# Patient Record
Sex: Male | Born: 1935 | Race: White | Hispanic: No | Smoking: Former smoker
Health system: Southern US, Community
[De-identification: ages and names within clinical notes are randomized; demographics above are authoritative.]

## PROBLEM LIST (undated history)

## (undated) DIAGNOSIS — N289 Disorder of kidney and ureter, unspecified: Secondary | ICD-10-CM

## (undated) DIAGNOSIS — I739 Peripheral vascular disease, unspecified: Secondary | ICD-10-CM

## (undated) DIAGNOSIS — I10 Essential (primary) hypertension: Secondary | ICD-10-CM

## (undated) DIAGNOSIS — I872 Venous insufficiency (chronic) (peripheral): Secondary | ICD-10-CM

## (undated) DIAGNOSIS — C4491 Basal cell carcinoma of skin, unspecified: Secondary | ICD-10-CM

## (undated) DIAGNOSIS — K219 Gastro-esophageal reflux disease without esophagitis: Secondary | ICD-10-CM

## (undated) DIAGNOSIS — C679 Malignant neoplasm of bladder, unspecified: Secondary | ICD-10-CM

## (undated) DIAGNOSIS — M4646 Discitis, unspecified, lumbar region: Secondary | ICD-10-CM

## (undated) HISTORY — PX: CAROTID STENT: SHX1301

## (undated) HISTORY — PX: SHOULDER ARTHROSCOPY DISTAL CLAVICLE EXCISION AND OPEN ROTATOR CUFF REPAIR: SHX2396

## (undated) HISTORY — DX: Malignant neoplasm of bladder, unspecified: C67.9

## (undated) HISTORY — DX: Essential (primary) hypertension: I10

## (undated) HISTORY — DX: Venous insufficiency (chronic) (peripheral): I87.2

## (undated) HISTORY — DX: Basal cell carcinoma of skin, unspecified: C44.91

## (undated) HISTORY — PX: APPENDECTOMY: SHX54

## (undated) HISTORY — DX: Disorder of kidney and ureter, unspecified: N28.9

## (undated) HISTORY — DX: Discitis, unspecified, lumbar region: M46.46

## (undated) HISTORY — DX: Peripheral vascular disease, unspecified: I73.9

## (undated) HISTORY — DX: Gastro-esophageal reflux disease without esophagitis: K21.9

---
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
## 1999-04-14 HISTORY — PX: ESOPHAGOGASTRODUODENOSCOPY: SHX1529

## 1999-05-05 ENCOUNTER — Encounter: Payer: Self-pay | Admitting: Gastroenterology

## 2001-10-24 ENCOUNTER — Encounter: Payer: Self-pay | Admitting: Internal Medicine

## 2001-12-12 HISTORY — PX: KNEE ARTHROSCOPY: SUR90

## 2003-02-04 ENCOUNTER — Encounter: Payer: Self-pay | Admitting: Internal Medicine

## 2004-08-21 ENCOUNTER — Ambulatory Visit: Payer: Self-pay | Admitting: Specialist

## 2004-09-01 ENCOUNTER — Ambulatory Visit: Payer: Self-pay | Admitting: Internal Medicine

## 2005-01-01 ENCOUNTER — Ambulatory Visit: Payer: Self-pay | Admitting: Internal Medicine

## 2005-02-12 ENCOUNTER — Ambulatory Visit: Payer: Self-pay | Admitting: Internal Medicine

## 2005-05-06 ENCOUNTER — Ambulatory Visit: Payer: Self-pay | Admitting: Internal Medicine

## 2005-08-31 ENCOUNTER — Ambulatory Visit: Payer: Self-pay | Admitting: Internal Medicine

## 2005-12-30 ENCOUNTER — Ambulatory Visit: Payer: Self-pay | Admitting: Internal Medicine

## 2006-02-11 ENCOUNTER — Ambulatory Visit: Payer: Self-pay | Admitting: Otolaryngology

## 2006-02-11 ENCOUNTER — Other Ambulatory Visit: Payer: Self-pay

## 2006-02-14 ENCOUNTER — Ambulatory Visit: Payer: Self-pay | Admitting: Internal Medicine

## 2006-02-16 ENCOUNTER — Ambulatory Visit: Payer: Self-pay | Admitting: Otolaryngology

## 2006-06-16 ENCOUNTER — Ambulatory Visit: Payer: Self-pay | Admitting: Internal Medicine

## 2006-07-12 ENCOUNTER — Ambulatory Visit: Payer: Self-pay | Admitting: Internal Medicine

## 2006-08-01 ENCOUNTER — Ambulatory Visit: Payer: Self-pay | Admitting: Internal Medicine

## 2006-12-20 ENCOUNTER — Ambulatory Visit: Payer: Self-pay | Admitting: Internal Medicine

## 2006-12-20 LAB — CONVERTED CEMR LAB
AST: 23 units/L (ref 0–37)
Albumin: 3.4 g/dL — ABNORMAL LOW (ref 3.5–5.2)
BUN: 21 mg/dL (ref 6–23)
CO2: 28 meq/L (ref 19–32)
Creatinine,U: 94.3 mg/dL
Phosphorus: 3.2 mg/dL (ref 2.3–4.6)
Potassium: 4.7 meq/L (ref 3.5–5.1)
Total Bilirubin: 0.6 mg/dL (ref 0.3–1.2)

## 2006-12-22 ENCOUNTER — Encounter: Payer: Self-pay | Admitting: Internal Medicine

## 2007-01-05 ENCOUNTER — Ambulatory Visit: Payer: Self-pay | Admitting: Internal Medicine

## 2007-01-17 ENCOUNTER — Ambulatory Visit: Payer: Self-pay | Admitting: Urology

## 2007-01-31 ENCOUNTER — Encounter: Payer: Self-pay | Admitting: Internal Medicine

## 2007-02-28 ENCOUNTER — Encounter: Payer: Self-pay | Admitting: Internal Medicine

## 2007-05-31 DIAGNOSIS — K219 Gastro-esophageal reflux disease without esophagitis: Secondary | ICD-10-CM

## 2007-05-31 DIAGNOSIS — I10 Essential (primary) hypertension: Secondary | ICD-10-CM | POA: Insufficient documentation

## 2007-05-31 DIAGNOSIS — E119 Type 2 diabetes mellitus without complications: Secondary | ICD-10-CM

## 2007-05-31 DIAGNOSIS — C679 Malignant neoplasm of bladder, unspecified: Secondary | ICD-10-CM

## 2007-05-31 DIAGNOSIS — I739 Peripheral vascular disease, unspecified: Secondary | ICD-10-CM | POA: Insufficient documentation

## 2007-05-31 DIAGNOSIS — I872 Venous insufficiency (chronic) (peripheral): Secondary | ICD-10-CM | POA: Insufficient documentation

## 2007-06-05 ENCOUNTER — Ambulatory Visit: Payer: Self-pay | Admitting: Internal Medicine

## 2007-06-05 DIAGNOSIS — J069 Acute upper respiratory infection, unspecified: Secondary | ICD-10-CM | POA: Insufficient documentation

## 2007-06-20 ENCOUNTER — Ambulatory Visit: Payer: Self-pay | Admitting: Internal Medicine

## 2007-06-20 DIAGNOSIS — L570 Actinic keratosis: Secondary | ICD-10-CM | POA: Insufficient documentation

## 2007-06-21 LAB — CONVERTED CEMR LAB
Albumin: 3.6 g/dL (ref 3.5–5.2)
BUN: 14 mg/dL (ref 6–23)
Calcium: 9.6 mg/dL (ref 8.4–10.5)
Chloride: 105 meq/L (ref 96–112)
Creatinine, Ser: 1.2 mg/dL (ref 0.4–1.5)
GFR calc non Af Amer: 62 mL/min
Hgb A1c MFr Bld: 7.1 % — ABNORMAL HIGH (ref 4.6–6.0)

## 2007-10-16 ENCOUNTER — Ambulatory Visit: Payer: Self-pay | Admitting: Internal Medicine

## 2007-10-17 ENCOUNTER — Encounter: Payer: Self-pay | Admitting: Internal Medicine

## 2007-10-18 LAB — CONVERTED CEMR LAB
BUN: 17 mg/dL (ref 6–23)
Basophils Absolute: 0 10*3/uL (ref 0.0–0.1)
Calcium: 9 mg/dL (ref 8.4–10.5)
Cholesterol: 190 mg/dL (ref 0–200)
Creatinine,U: 143 mg/dL
Eosinophils Absolute: 0.4 10*3/uL (ref 0.0–0.6)
GFR calc Af Amer: 54 mL/min
Glucose, Bld: 200 mg/dL — ABNORMAL HIGH (ref 70–99)
HDL: 42.8 mg/dL (ref >39.0)
MCHC: 33.3 g/dL (ref 30.0–36.0)
MCV: 92.2 fL (ref 78.0–100.0)
Microalb, Ur: 105.3 mg/dL — ABNORMAL HIGH (ref 0.0–1.9)
Neutro Abs: 6.8 10*3/uL (ref 1.4–7.7)
Phosphorus: 3.6 mg/dL (ref 2.3–4.6)
Platelets: 244 10*3/uL (ref 150–400)
Potassium: 4.8 meq/L (ref 3.5–5.1)
RBC: 4.19 M/uL — ABNORMAL LOW (ref 4.22–5.81)
TSH: 1.45 microintl units/mL (ref 0.35–5.50)
Triglycerides: 243 mg/dL (ref 0–149)

## 2008-01-08 ENCOUNTER — Telehealth (INDEPENDENT_AMBULATORY_CARE_PROVIDER_SITE_OTHER): Payer: Self-pay | Admitting: *Deleted

## 2008-01-18 ENCOUNTER — Encounter: Payer: Self-pay | Admitting: Internal Medicine

## 2008-02-12 ENCOUNTER — Ambulatory Visit: Payer: Self-pay | Admitting: Internal Medicine

## 2008-02-12 DIAGNOSIS — N259 Disorder resulting from impaired renal tubular function, unspecified: Secondary | ICD-10-CM | POA: Insufficient documentation

## 2008-02-13 LAB — CONVERTED CEMR LAB
Albumin: 3.6 g/dL (ref 3.5–5.2)
BUN: 17 mg/dL (ref 6–23)
Basophils Relative: 0.2 % (ref 0.0–1.0)
CO2: 27 meq/L (ref 19–32)
Calcium: 9.3 mg/dL (ref 8.4–10.5)
Creatinine, Ser: 1.4 mg/dL (ref 0.4–1.5)
GFR calc non Af Amer: 52 mL/min
Glucose, Bld: 203 mg/dL — ABNORMAL HIGH (ref 70–99)
HCT: 39.5 % (ref 39.0–52.0)
Hemoglobin: 13.6 g/dL (ref 13.0–17.0)
Lymphocytes Relative: 27 % (ref 12.0–46.0)
Monocytes Absolute: 0.6 10*3/uL (ref 0.1–1.0)
Monocytes Relative: 9.7 % (ref 3.0–12.0)
Neutro Abs: 4 10*3/uL (ref 1.4–7.7)
RBC: 4.26 M/uL (ref 4.22–5.81)
RDW: 14 % (ref 11.5–14.6)
Sodium: 141 meq/L (ref 135–145)

## 2008-04-22 ENCOUNTER — Encounter: Payer: Self-pay | Admitting: Internal Medicine

## 2008-05-22 ENCOUNTER — Ambulatory Visit: Payer: Self-pay | Admitting: Internal Medicine

## 2008-06-13 ENCOUNTER — Ambulatory Visit: Payer: Self-pay | Admitting: Gastroenterology

## 2008-06-14 ENCOUNTER — Ambulatory Visit: Payer: Self-pay | Admitting: Internal Medicine

## 2008-06-14 ENCOUNTER — Telehealth: Payer: Self-pay | Admitting: Gastroenterology

## 2008-06-17 ENCOUNTER — Ambulatory Visit: Payer: Self-pay | Admitting: Gastroenterology

## 2008-06-26 ENCOUNTER — Ambulatory Visit: Payer: Self-pay | Admitting: Gastroenterology

## 2008-06-26 LAB — CONVERTED CEMR LAB
Fecal Occult Blood: NEGATIVE
OCCULT 2: NEGATIVE
OCCULT 3: NEGATIVE
OCCULT 4: NEGATIVE
OCCULT 5: NEGATIVE

## 2008-06-27 ENCOUNTER — Ambulatory Visit: Payer: Self-pay | Admitting: Gastroenterology

## 2008-06-27 ENCOUNTER — Encounter: Payer: Self-pay | Admitting: Gastroenterology

## 2008-07-01 ENCOUNTER — Encounter: Payer: Self-pay | Admitting: Gastroenterology

## 2008-09-18 ENCOUNTER — Ambulatory Visit: Payer: Self-pay | Admitting: Family Medicine

## 2008-10-02 ENCOUNTER — Telehealth: Payer: Self-pay | Admitting: Internal Medicine

## 2008-10-18 ENCOUNTER — Telehealth: Payer: Self-pay | Admitting: Internal Medicine

## 2008-10-18 ENCOUNTER — Ambulatory Visit: Payer: Self-pay | Admitting: Internal Medicine

## 2008-10-21 LAB — CONVERTED CEMR LAB
BUN: 18 mg/dL (ref 6–23)
Calcium: 9.7 mg/dL (ref 8.4–10.5)
Chloride: 109 meq/L (ref 96–112)
Cholesterol: 194 mg/dL (ref 0–200)
Glucose, Bld: 151 mg/dL — ABNORMAL HIGH (ref 70–99)
Microalb, Ur: 0.5 mg/dL (ref 0.00–1.89)
Potassium: 5.3 meq/L (ref 3.5–5.3)
Sodium: 144 meq/L (ref 135–145)
Total CHOL/HDL Ratio: 3.6

## 2008-11-05 ENCOUNTER — Telehealth: Payer: Self-pay | Admitting: Internal Medicine

## 2008-12-30 ENCOUNTER — Ambulatory Visit: Payer: Self-pay | Admitting: Specialist

## 2009-02-24 ENCOUNTER — Ambulatory Visit: Payer: Self-pay

## 2009-03-18 ENCOUNTER — Ambulatory Visit: Payer: Self-pay | Admitting: Cardiovascular Disease

## 2009-03-18 ENCOUNTER — Ambulatory Visit: Payer: Self-pay | Admitting: Ophthalmology

## 2009-04-08 ENCOUNTER — Ambulatory Visit: Payer: Self-pay | Admitting: Ophthalmology

## 2009-06-09 ENCOUNTER — Ambulatory Visit: Payer: Self-pay | Admitting: Internal Medicine

## 2009-06-11 LAB — CONVERTED CEMR LAB
ALT: 12 units/L (ref 0–53)
AST: 18 units/L (ref 0–37)
Alkaline Phosphatase: 54 units/L (ref 39–117)
BUN: 20 mg/dL (ref 6–23)
Basophils Absolute: 0.3 10*3/uL — ABNORMAL HIGH (ref 0.0–0.1)
CO2: 27 meq/L (ref 19–32)
Chloride: 113 meq/L — ABNORMAL HIGH (ref 96–112)
Creatinine, Ser: 1.2 mg/dL (ref 0.4–1.5)
Eosinophils Relative: 3.5 % (ref 0.0–5.0)
GFR calc non Af Amer: 61.68 mL/min (ref >60)
Hemoglobin: 13.5 g/dL (ref 13.0–17.0)
Hgb A1c MFr Bld: 6.2 % (ref 4.6–6.5)
Lymphocytes Relative: 25.5 % (ref 12.0–46.0)
Monocytes Relative: 9.3 % (ref 3.0–12.0)
Phosphorus: 3.7 mg/dL (ref 2.3–4.6)
Platelets: 226 10*3/uL (ref 150.0–400.0)
RDW: 13.5 % (ref 11.5–14.6)
Sodium: 144 meq/L (ref 135–145)
Total Bilirubin: 0.6 mg/dL (ref 0.3–1.2)
WBC: 10.5 10*3/uL (ref 4.5–10.5)

## 2009-08-10 ENCOUNTER — Inpatient Hospital Stay: Payer: Self-pay | Admitting: Internal Medicine

## 2009-08-10 ENCOUNTER — Ambulatory Visit: Payer: Self-pay | Admitting: Cardiology

## 2009-08-11 ENCOUNTER — Telehealth: Payer: Self-pay | Admitting: Internal Medicine

## 2009-08-13 ENCOUNTER — Ambulatory Visit: Payer: Self-pay

## 2009-08-19 ENCOUNTER — Encounter: Payer: Self-pay | Admitting: Internal Medicine

## 2009-09-01 ENCOUNTER — Encounter: Payer: Self-pay | Admitting: Internal Medicine

## 2009-09-02 DIAGNOSIS — G062 Extradural and subdural abscess, unspecified: Secondary | ICD-10-CM | POA: Insufficient documentation

## 2009-09-03 ENCOUNTER — Ambulatory Visit: Payer: Self-pay | Admitting: Specialist

## 2009-09-03 ENCOUNTER — Telehealth: Payer: Self-pay | Admitting: Internal Medicine

## 2009-09-09 ENCOUNTER — Encounter: Payer: Self-pay | Admitting: Internal Medicine

## 2009-10-06 ENCOUNTER — Ambulatory Visit: Payer: Self-pay | Admitting: Specialist

## 2009-10-28 ENCOUNTER — Ambulatory Visit: Payer: Self-pay | Admitting: Internal Medicine

## 2009-11-03 ENCOUNTER — Ambulatory Visit: Payer: Self-pay | Admitting: Specialist

## 2009-11-06 ENCOUNTER — Ambulatory Visit: Payer: Self-pay | Admitting: Internal Medicine

## 2009-11-10 ENCOUNTER — Encounter: Payer: Self-pay | Admitting: Internal Medicine

## 2009-11-11 ENCOUNTER — Ambulatory Visit: Payer: Self-pay

## 2009-11-18 ENCOUNTER — Ambulatory Visit: Payer: Self-pay | Admitting: Vascular Surgery

## 2009-11-20 ENCOUNTER — Ambulatory Visit: Payer: Self-pay | Admitting: Internal Medicine

## 2009-12-01 ENCOUNTER — Encounter: Payer: Self-pay | Admitting: Internal Medicine

## 2009-12-05 ENCOUNTER — Ambulatory Visit: Payer: Self-pay

## 2009-12-08 ENCOUNTER — Ambulatory Visit: Payer: Self-pay | Admitting: Internal Medicine

## 2009-12-10 LAB — CONVERTED CEMR LAB
AST: 18 units/L (ref 0–37)
Albumin: 3.2 g/dL — ABNORMAL LOW (ref 3.5–5.2)
BUN: 19 mg/dL (ref 6–23)
Basophils Absolute: 0 10*3/uL (ref 0.0–0.1)
CO2: 30 meq/L (ref 19–32)
Calcium: 9.5 mg/dL (ref 8.4–10.5)
Chloride: 101 meq/L (ref 96–112)
Eosinophils Absolute: 0 10*3/uL (ref 0.0–0.7)
HCT: 37.4 % — ABNORMAL LOW (ref 39.0–52.0)
Hemoglobin: 12.2 g/dL — ABNORMAL LOW (ref 13.0–17.0)
Lymphs Abs: 0.9 10*3/uL (ref 0.7–4.0)
MCHC: 32.6 g/dL (ref 30.0–36.0)
MCV: 93.4 fL (ref 78.0–100.0)
Monocytes Absolute: 0.9 10*3/uL (ref 0.1–1.0)
Monocytes Relative: 7.8 % (ref 3.0–12.0)
Neutro Abs: 9.3 10*3/uL — ABNORMAL HIGH (ref 1.4–7.7)
RDW: 14.3 % (ref 11.5–14.6)

## 2009-12-11 ENCOUNTER — Encounter: Payer: Self-pay | Admitting: Internal Medicine

## 2009-12-11 ENCOUNTER — Ambulatory Visit: Payer: Self-pay | Admitting: Internal Medicine

## 2009-12-12 ENCOUNTER — Ambulatory Visit: Payer: Self-pay

## 2009-12-18 ENCOUNTER — Ambulatory Visit: Payer: Self-pay | Admitting: Specialist

## 2009-12-19 ENCOUNTER — Ambulatory Visit: Payer: Self-pay | Admitting: Internal Medicine

## 2009-12-19 ENCOUNTER — Telehealth: Payer: Self-pay | Admitting: Internal Medicine

## 2009-12-19 DIAGNOSIS — B356 Tinea cruris: Secondary | ICD-10-CM

## 2009-12-25 ENCOUNTER — Ambulatory Visit: Payer: Self-pay | Admitting: Internal Medicine

## 2010-01-09 ENCOUNTER — Encounter: Payer: Self-pay | Admitting: Internal Medicine

## 2010-01-11 HISTORY — PX: CHOLECYSTECTOMY: SHX55

## 2010-01-13 ENCOUNTER — Ambulatory Visit: Payer: Self-pay | Admitting: Internal Medicine

## 2010-01-20 ENCOUNTER — Ambulatory Visit: Payer: Self-pay | Admitting: Internal Medicine

## 2010-01-25 ENCOUNTER — Ambulatory Visit: Payer: Self-pay | Admitting: Cardiovascular Disease

## 2010-01-25 ENCOUNTER — Inpatient Hospital Stay: Payer: Self-pay | Admitting: Surgery

## 2010-01-26 ENCOUNTER — Encounter: Payer: Self-pay | Admitting: Internal Medicine

## 2010-02-05 ENCOUNTER — Telehealth: Payer: Self-pay | Admitting: Internal Medicine

## 2010-02-07 IMAGING — XA DG CHEST 1V
1 series · 1 of 1 positions shown · non-contrast
Comparison: none

REASON FOR EXAM: PICC Line Placement
COMMENTS:

PROCEDURE:     VAS - CHEST FRONTAL SINGLE VIEW  - August 15, 2009  [DATE]
RESULT:     Spot film chest reveals a PICC line in the region of the
superior vena cava/right atrial junction.

[Series 1: single · 1 of 1 slices shown]
[im 1/1]
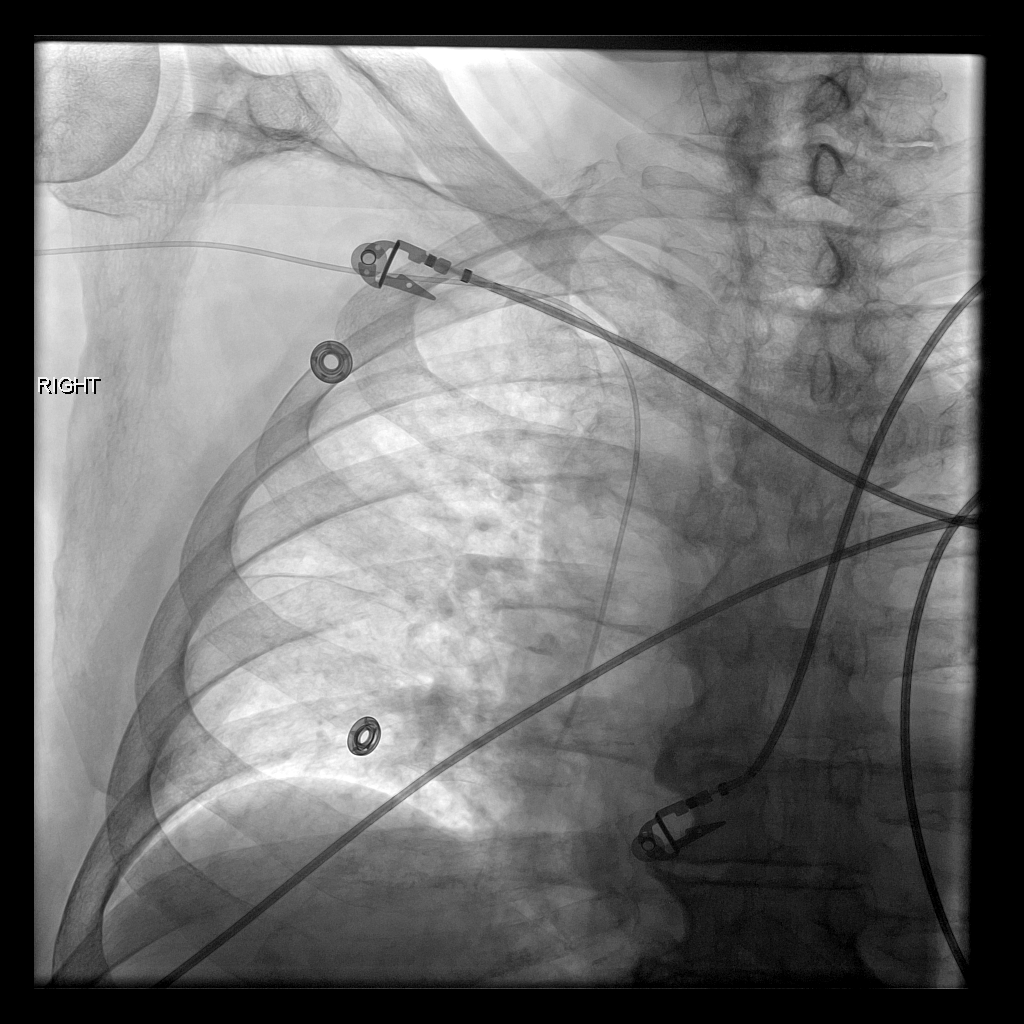

[1 of 1 positions shown; findings below may reference images not displayed]

IMPRESSION: Good anatomic positioning of PICC line.

## 2010-02-13 ENCOUNTER — Ambulatory Visit: Payer: Self-pay | Admitting: Internal Medicine

## 2010-02-17 ENCOUNTER — Encounter: Payer: Self-pay | Admitting: Internal Medicine

## 2010-02-23 ENCOUNTER — Encounter: Payer: Self-pay | Admitting: Internal Medicine

## 2010-02-27 ENCOUNTER — Ambulatory Visit: Payer: Self-pay | Admitting: Internal Medicine

## 2010-03-02 ENCOUNTER — Telehealth: Payer: Self-pay | Admitting: Internal Medicine

## 2010-03-13 HISTORY — PX: TOE AMPUTATION: SHX809

## 2010-03-20 ENCOUNTER — Ambulatory Visit: Payer: Self-pay | Admitting: Vascular Surgery

## 2010-03-24 ENCOUNTER — Ambulatory Visit: Payer: Self-pay | Admitting: Vascular Surgery

## 2010-03-26 LAB — PATHOLOGY REPORT

## 2010-03-27 ENCOUNTER — Ambulatory Visit: Payer: Self-pay | Admitting: Internal Medicine

## 2010-04-06 ENCOUNTER — Telehealth: Payer: Self-pay | Admitting: Internal Medicine

## 2010-04-17 ENCOUNTER — Encounter: Payer: Self-pay | Admitting: Internal Medicine

## 2010-05-01 ENCOUNTER — Ambulatory Visit: Payer: Self-pay | Admitting: Internal Medicine

## 2010-05-04 LAB — CONVERTED CEMR LAB
ALT: 11 units/L (ref 0–53)
AST: 14 units/L (ref 0–37)
Bilirubin, Direct: 0.1 mg/dL (ref 0.0–0.3)
CO2: 27 meq/L (ref 19–32)
Creatinine, Ser: 1.2 mg/dL (ref 0.4–1.5)
Eosinophils Relative: 2 % (ref 0.0–5.0)
GFR calc non Af Amer: 64 mL/min (ref >60)
Glucose, Bld: 89 mg/dL (ref 70–99)
HCT: 34.4 % — ABNORMAL LOW (ref 39.0–52.0)
Hgb A1c MFr Bld: 6 % (ref 4.6–6.5)
Monocytes Relative: 9.6 % (ref 3.0–12.0)
Neutrophils Relative %: 58.4 % (ref 43.0–77.0)
Platelets: 227 10*3/uL (ref 150.0–400.0)
Potassium: 4.7 meq/L (ref 3.5–5.1)
Sodium: 143 meq/L (ref 135–145)
Total Bilirubin: 0.4 mg/dL (ref 0.3–1.2)
WBC: 8.8 10*3/uL (ref 4.5–10.5)

## 2010-05-14 ENCOUNTER — Encounter: Payer: Self-pay | Admitting: Internal Medicine

## 2010-05-25 ENCOUNTER — Telehealth: Payer: Self-pay | Admitting: Internal Medicine

## 2010-07-24 ENCOUNTER — Encounter: Payer: Self-pay | Admitting: Internal Medicine

## 2010-08-25 ENCOUNTER — Encounter: Payer: Self-pay | Admitting: Internal Medicine

## 2010-08-31 ENCOUNTER — Ambulatory Visit: Payer: Self-pay | Admitting: Internal Medicine

## 2010-10-14 NOTE — Assessment & Plan Note (Signed)
Summary: ROA 1 WK  CYD   Vital Signs:  Patient profile:   77 year old male Weight:      205 pounds Temp:     98.5 degrees F oral Pulse rate:   68 / minute Pulse rhythm:   regular BP sitting:   170 / 60  (left arm) Cuff size:   large  Vitals Entered By: Mervin Hack CMA Duncan Dull) (December 19, 2009 11:38 AM) CC: 1 week follow-up   History of Present Illness: On two times a day rocephin still Sees Dr Blocker later today--may be approaching end of treatment course for the discitis  Went back to his 70/30 regimen Had to cut dose from 33 to 25 ongoing hypoglycemic spells that are now better on the lower dose  Doesn't check BP No headache No chest pain No sig SOB  Living on his own Has nurse from [facility] and aide Changing foot bandage and instructing about PICC line managing meds   Still with occ pain Bad when he forgot the oxycontin one morning when going for MRI only takes the oxycodone occ--most is 1 per day hasn't been using the patch  Rash in right groin no tiching using desitin and A&D no other similar areas  Allergies: No Known Drug Allergies  Past History:  Past medical, surgical, family and social histories (including risk factors) reviewed for relevance to current acute and chronic problems.  Past Medical History: Reviewed history from 02/12/2008 and no changes required. Diabetes mellitus, type II--nephropathy, neuropathy, retinopathy, PVD Peripheral vascular disease Hypertension GERD/esophagitis Bladder cancer Basal cell carcinoma--R temple Chronic venous insufficiency Renal insufficiency  CONSULTANTS Dr Orson Slick  [phone] Dr Al Corpus Dr Guinevere Scarlet Dr Melinda Crutch [phone]  Past Surgical History: Reviewed history from 12/08/2009 and no changes required. EGD 08/00 TURBT (pedunculated)  ~1993 Left shoulder separation repair 1950's Left knee arthroscopy Hyacinth Meeker) 04/03 TURBT 09/04 ABIs abnormal 10/07 Appendectomy Clavicle repair Epidural  abscess 12/10   [facility]  (medical Rx only) Stent left leg--Dr Earnestine Leys  Family History: Reviewed history from 08/13/2009 and no changes required. Father died of heart disease No FH of Colon Cancer: History is remarkable for diabetes  Social History: Reviewed history from 06/13/2008 and no changes required. Widowed 4/07 Children: 1 son and 1 daughter Alcohol use-no Retired Patient is a former smoker.  Daily Caffeine Use  Physical Exam  General:  alert.  NAD Neck:  supple and no masses.   Lungs:  normal respiratory effort and normal breath sounds.   Heart:  normal rate, regular rhythm, no murmur, and no gallop.   Skin:  scaly rash scattered along right inguinal area Psych:  normally interactive, good eye contact, not anxious appearing, and not depressed appearing.     Impression & Recommendations:  Problem # 1:  TINEA CRURIS (ICD-110.3) Assessment New will try ketoconazole cream may need as long as he is still on the antibiotics  Problem # 2:  DIABETES MELLITUS, TYPE II (ICD-250.00) control is good no clear reason why he is on prednisone----may have been initally with inflammation around infected disc will wean off and may need lower dose of insulin  His updated medication list for this problem includes:    Aspirin 81 Mg Tbec (Aspirin) .Marland Kitchen... Take one by mouth once a day    Novolog Mix 70/30 Flexpen 70-30 % Susp (Insulin aspart prot & aspart) ..... Inject 25 units subcutaneously two times a day and adjust as directed.    Glipizide 5 Mg Xr24h-tab (Glipizide) .Marland Kitchen... 1 daily  before breakfast for diabetes  Labs Reviewed: Creat: 1.4 (12/08/2009)     Last Eye Exam: retinopathy stable (10/16/2008) Reviewed HgBA1c results: 6.7 (12/08/2009)  6.2 (06/09/2009)  Problem # 3:  ABSCESS, EPIDURAL (ICD-324.9) Assessment: Improved not taking fentanyl will continue the oxycontin for pain  Problem # 4:  HYPERTENSION (ICD-401.9) Assessment: Unchanged BP up some meds adjusted quite a bit  throughout this ordeal no changes now will review at his follow up  His updated medication list for this problem includes:    Norvasc 10 Mg Tabs (Amlodipine besylate) .Marland Kitchen... Take one by mouth once a day    Furosemide 40 Mg Tabs (Furosemide) .Marland Kitchen... 1 tab daily as needed for increased leg swelling    Clonidine Hcl 0.1 Mg/24hr Ptwk (Clonidine hcl) .Marland Kitchen... Apply 1 patch weekly for high blood pressure    Metoprolol Succinate 100 Mg Xr24h-tab (Metoprolol succinate) .Marland Kitchen... 1 tab daily for high blood pressure  BP today: 170/60 Prior BP: 150/60 (12/08/2009)  Labs Reviewed: K+: 3.9 (12/08/2009) Creat: : 1.4 (12/08/2009)   Chol: 194 (10/18/2008)   HDL: 54 (10/18/2008)   LDL: 85 (10/18/2008)   TG: 275 (10/18/2008)  Complete Medication List: 1)  Aspirin 81 Mg Tbec (Aspirin) .... Take one by mouth once a day 2)  Omeprazole 20 Mg Cpdr (Omeprazole) .... Take one by mouth once a day 3)  Norvasc 10 Mg Tabs (Amlodipine besylate) .... Take one by mouth once a day 4)  Vitamin E 400 Unit Caps (Vitamin e) .... Take one by mouth two times a day 5)  Cosamin Ds 500-400 Mg Tabs (Glucosamine-chondroitin) .... Take one by mouth once a day 6)  Novolog Mix 70/30 Flexpen 70-30 % Susp (Insulin aspart prot & aspart) .... Inject 25 units subcutaneously two times a day and adjust as directed. 7)  Furosemide 40 Mg Tabs (Furosemide) .Marland Kitchen.. 1 tab daily as needed for increased leg swelling 8)  Oxycontin 20 Mg Xr12h-tab (Oxycodone hcl) .Marland Kitchen.. 1 tab by mouth two times a day 9)  Oxycodone Hcl 5 Mg Tabs (Oxycodone hcl) .Marland Kitchen.. 1 by mouth every 4 hours as needed for pain 10)  Prednisone 10 Mg Tabs (Prednisone) .Marland Kitchen.. 1 tab every other day for 2 weeks then stop 11)  Clonidine Hcl 0.1 Mg/24hr Ptwk (Clonidine hcl) .... Apply 1 patch weekly for high blood pressure 12)  Metoprolol Succinate 100 Mg Xr24h-tab (Metoprolol succinate) .Marland Kitchen.. 1 tab daily for high blood pressure 13)  Miralax Powd (Polyethylene glycol 3350) .Marland KitchenMarland Kitchen. 17 gm with water two times a  day to keep bowels working 14)  Senokot S 8.6-50 Mg Tabs (Sennosides-docusate sodium) .... 2 tabs daily to prevent constipation 15)  Glipizide 5 Mg Xr24h-tab (Glipizide) .Marland Kitchen.. 1 daily before breakfast for diabetes 16)  Celebrex 400 Mg Caps (Celecoxib) .... Take 1 by mouth once daily  Patient Instructions: 1)  Please change the prednisone to 10mg  every other day for the next 2 weeks, then stop it..You may need to further decrease the insulin after this. 2)  Please schedule a follow-up appointment in 1 month.  Prescriptions: OXYCONTIN 20 MG XR12H-TAB (OXYCODONE HCL) 1 tab by mouth two times a day  #60 x 0   Entered and Authorized by:   Cindee Salt MD   Signed by:   Cindee Salt MD on 12/19/2009   Method used:   Print then Give to Patient   RxID:   [id number] PREDNISONE 10 MG TABS (PREDNISONE) 1 tab every other day for 2 weeks then stop  #7 x 0  Entered and Authorized by:   Cindee Salt MD   Signed by:   Cindee Salt MD on 12/19/2009   Method used:   Electronically to        [facility] (retail)       [address]       [place], [place]  [id number]       Ph: [phone]       Fax: [phone]   RxID:   [id number]   Current Allergies (reviewed today): No known allergies

## 2010-10-14 NOTE — Letter (Signed)
Summary: [facility]   Imported By: Lanelle Bal 09/15/2009 13:39:57  _____________________________________________________________________  External Attachment:    Type:   Image     Comment:   External Document  Appended Document: [facility] rocephin increased for epidural abscess no surgery

## 2010-10-14 NOTE — Letter (Signed)
Summary: Tora Kindred MD  Tora Kindred MD   Imported By: Lanelle Bal 11/13/2009 12:00:53  _____________________________________________________________________  External Attachment:    Type:   Image     Comment:   External Document  Appended Document: Tora Kindred MD non healing ulcers doing angiogram to see if revasularization is plausible

## 2010-10-14 NOTE — Progress Notes (Signed)
Summary: Oxycontin  Phone Note Refill Request Call back at Home Phone [phone] Message from:  Patient on March 02, 2010 9:47 AM  Refills Requested: Medication #1:  OXYCONTIN 20 MG XR12H-TAB 1 tab by mouth two times a day Please call patient when prescription is ready for pickup.    Method Requested: Pick up at Office Initial call taken by: Delilah Shan CMA Duncan Dull),  March 02, 2010 9:47 AM  Follow-up for Phone Call        Rx written Follow-up by: Cindee Salt MD,  March 02, 2010 1:14 PM  Additional Follow-up for Phone Call Additional follow up Details #1::        Advised pt ok to pick up. Additional Follow-up by: Lowella Petties CMA,  March 02, 2010 2:58 PM    Prescriptions: OXYCONTIN 20 MG XR12H-TAB (OXYCODONE HCL) 1 tab by mouth two times a day  #60 x 0   Entered and Authorized by:   Cindee Salt MD   Signed by:   Cindee Salt MD on 03/02/2010   Method used:   Print then Give to Patient   RxID:   [id number]

## 2010-10-14 NOTE — Assessment & Plan Note (Signed)
Summary: 6 MTH FU/CLE   Vital Signs:  Patient profile:   77 year old male Weight:      210 pounds Temp:     98.1 degrees F oral Pulse rate:   80 / minute Pulse rhythm:   regular BP sitting:   150 / 60  (left arm) Cuff size:   large  Vitals Entered By: Mervin Hack CMA Duncan Dull) (December 08, 2009 12:35 PM) CC: 6 month follow-up   History of Present Illness: admitted with epidural abscess in December No surgery Still getting IV antibiotics  IN [place] till he went home 2 days ago many changes in meds  ongoing pain--using oxycodone ER and rapid acting Dr Blocker handling ID consults Dr Alison Murray managing diabetic wounds  Has recent follow up with neurosurgeon improving and no changes made  checks sugars himself again had been getting every 4 hours and getting coverage he is back to his 70/30 insulin and his previous dosing  No chest pain No SOB  Living alone Neighbor came with him today  Allergies: No Known Drug Allergies  Past History:  Past medical, surgical, family and social histories (including risk factors) reviewed for relevance to current acute and chronic problems.  Past Medical History: Reviewed history from 02/12/2008 and no changes required. Diabetes mellitus, type II--nephropathy, neuropathy, retinopathy, PVD Peripheral vascular disease Hypertension GERD/esophagitis Bladder cancer Basal cell carcinoma--R temple Chronic venous insufficiency Renal insufficiency  CONSULTANTS Dr Orson Slick  [phone] Dr Al Corpus Dr Guinevere Scarlet Dr Melinda Crutch [phone]  Past Surgical History: EGD 08/00 TURBT (pedunculated)  ~1993 Left shoulder separation repair 1950's Left knee arthroscopy Hyacinth Meeker) 04/03 TURBT 09/04 ABIs abnormal 10/07 Appendectomy Clavicle repair Epidural abscess 12/10   [facility]  (medical Rx only) Stent left leg--Dr Earnestine Leys  Family History: Reviewed history from 08/13/2009 and no changes required. Father died of heart disease No FH of Colon  Cancer: History is remarkable for diabetes  Social History: Reviewed history from 06/13/2008 and no changes required. Widowed 4/07 Children: 1 son and 1 daughter Alcohol use-no Retired Patient is a former smoker.  Daily Caffeine Use  Review of Systems       weight loss of 50# through this illness sleeping okay Modd is okay--not depressed  Physical Exam  General:  alert.  NAD Neck:  supple, no masses, no thyromegaly, and no cervical lymphadenopathy.   Lungs:  normal respiratory effort and normal breath sounds.   Heart:  normal rate, no murmur, and no gallop.   Regular in general but occ skip beats Abdomen:  soft and non-tender.   Msk:  non tender bulge over L2 or 3 Extremities:  mild edema Skin:  left foot in iodosorb dressing Not examined Psych:  normally interactive, good eye contact, not anxious appearing, and not depressed appearing.     Impression & Recommendations:  Problem # 1:  ABSCESS, EPIDURAL (ICD-324.9) Assessment New on rocephin followed by Dr Blocker Not sure why he is on the prednisone will adjust his pain meds at next visit  Problem # 2:  DIABETES MELLITUS, TYPE II (ICD-250.00) Assessment: Comment Only  low sugar reaction here---needed glucose tabs and nabs went back to novolog 70/30 Now on glipizide instead of metformin---??due to renal problems  The following medications were removed from the medication list:    Metformin Hcl 1000 Mg Tabs (Metformin hcl) .Marland Kitchen... 1 tablet by mouth twice a day    Lisinopril-hydrochlorothiazide 20-12.5 Mg Tabs (Lisinopril-hydrochlorothiazide) .Marland Kitchen... Take one by mouth two times a day His updated medication list for this  problem includes:    Aspirin 81 Mg Tbec (Aspirin) .Marland Kitchen... Take one by mouth once a day    Novolog Mix 70/30 Flexpen 70-30 % Susp (Insulin aspart prot & aspart) ..... Inject 33 units subcutaneously two times a day and adjust as directed.    Glipizide 5 Mg Xr24h-tab (Glipizide) .Marland Kitchen... 1 daily before  breakfast for diabetes  Orders: TLB-A1C / Hgb A1C (Glycohemoglobin) (83036-A1C) Venipuncture (16109) TLB-Renal Function Panel (80069-RENAL) TLB-CBC Platelet - w/Differential (85025-CBCD) TLB-Hepatic/Liver Function Pnl (80076-HEPATIC)  Problem # 3:  HYPERTENSION (ICD-401.9) Assessment: Unchanged fair control list adjusted to changes made in hospital or nursing home  The following medications were removed from the medication list:    Zebeta 5 Mg Tabs (Bisoprolol fumarate) .Marland Kitchen... Take one by mouth once a day    Lisinopril-hydrochlorothiazide 20-12.5 Mg Tabs (Lisinopril-hydrochlorothiazide) .Marland Kitchen... Take one by mouth two times a day His updated medication list for this problem includes:    Norvasc 10 Mg Tabs (Amlodipine besylate) .Marland Kitchen... Take one by mouth once a day    Furosemide 40 Mg Tabs (Furosemide) .Marland Kitchen... 1 tab daily as needed for increased leg swelling    Clonidine Hcl 0.1 Mg/24hr Ptwk (Clonidine hcl) .Marland Kitchen... Apply 1 patch weekly for high blood pressure    Metoprolol Succinate 100 Mg Xr24h-tab (Metoprolol succinate) .Marland Kitchen... 1 tab daily for high blood pressure  BP today: 150/60 Prior BP: 160/70 (06/09/2009)  Labs Reviewed: K+: 4.9 (06/09/2009) Creat: : 1.2 (06/09/2009)   Chol: 194 (10/18/2008)   HDL: 54 (10/18/2008)   LDL: 85 (10/18/2008)   TG: 275 (10/18/2008)  Complete Medication List: 1)  Aspirin 81 Mg Tbec (Aspirin) .... Take one by mouth once a day 2)  Omeprazole 20 Mg Cpdr (Omeprazole) .... Take one by mouth once a day 3)  Norvasc 10 Mg Tabs (Amlodipine besylate) .... Take one by mouth once a day 4)  Vitamin E 400 Unit Caps (Vitamin e) .... Take one by mouth two times a day 5)  Cosamin Ds 500-400 Mg Tabs (Glucosamine-chondroitin) .... Take one by mouth once a day 6)  Bd Single Use Swabs Regular Pads (Alcohol swabs) .... Use as directed 7)  Novolog Mix 70/30 Flexpen 70-30 % Susp (Insulin aspart prot & aspart) .... Inject 33 units subcutaneously two times a day and adjust as  directed. 8)  Furosemide 40 Mg Tabs (Furosemide) .Marland Kitchen.. 1 tab daily as needed for increased leg swelling 9)  Oxycontin 20 Mg Xr12h-tab (Oxycodone hcl) .Marland Kitchen.. 1 tab by mouth two times a day 10)  Oxycodone Hcl 5 Mg Tabs (Oxycodone hcl) .Marland Kitchen.. 1 by mouth every 4 hours as needed for pain 11)  Prednisone 10 Mg Tabs (Prednisone) .Marland Kitchen.. 1 tab daily 12)  Clonidine Hcl 0.1 Mg/24hr Ptwk (Clonidine hcl) .... Apply 1 patch weekly for high blood pressure 13)  Metoprolol Succinate 100 Mg Xr24h-tab (Metoprolol succinate) .Marland Kitchen.. 1 tab daily for high blood pressure 14)  Fentanyl 75 Mcg/hr Pt72 (Fentanyl) .... Apply 1 patch every 3 days for pain control 15)  Miralax Powd (Polyethylene glycol 3350) .Marland KitchenMarland Kitchen. 17 gm with water two times a day to keep bowels working 16)  Senokot S 8.6-50 Mg Tabs (Sennosides-docusate sodium) .... 2 tabs daily to prevent constipation 17)  Glipizide 5 Mg Xr24h-tab (Glipizide) .Marland Kitchen.. 1 daily before breakfast for diabetes  Patient Instructions: 1)  Go back to your usual insulin 2)  Keep on the other mediciations as listed on this sheet 3)  Continue the rocephin antibiotic intravenously till Dr Leavy Cella tells you to stop 4)  Note how your pain is and how often you need the extra pain pill and we will adjust at your next visit 5)  Please schedule a follow-up appointment in 1 week.  Current Allergies (reviewed today): No known allergies

## 2010-10-14 NOTE — Progress Notes (Signed)
Summary: refill request for oxycontin  Phone Note Refill Request Call back at Home Phone [phone] Message from:  Patient  Refills Requested: Medication #1:  OXYCONTIN 20 MG XR12H-TAB 1 tab by mouth two times a day Please call pt when ready.  Initial call taken by: Lowella Petties CMA,  May 25, 2010 11:34 AM  Follow-up for Phone Call        Rx written Follow-up by: Cindee Salt MD,  May 25, 2010 1:58 PM  Additional Follow-up for Phone Call Additional follow up Details #1::        Spoke with patient and advised rx ready for pick-up  Additional Follow-up by: Mervin Hack CMA Duncan Dull),  May 25, 2010 2:26 PM    New/Updated Medications: OXYCONTIN 20 MG XR12H-TAB (OXYCODONE HCL) 1 tab by mouth two times a day Prescriptions: OXYCONTIN 20 MG XR12H-TAB (OXYCODONE HCL) 1 tab by mouth two times a day  #60 x 0   Entered and Authorized by:   Cindee Salt MD   Signed by:   Cindee Salt MD on 05/25/2010   Method used:   Print then Give to Patient   RxID:   [id number]

## 2010-10-14 NOTE — Assessment & Plan Note (Signed)
Summary: 1 MONTH FOLLOW UP/[facility]   Vital Signs:  Patient profile:   77 year old male Weight:      210 pounds Temp:     98.2 degrees F oral Pulse rate:   56 / minute Pulse rhythm:   regular BP sitting:   130 / 60  (left arm) Cuff size:   large  Vitals Entered By: Mervin Hack CMA Duncan Dull) (Jan 20, 2010 11:40 AM) CC: 1 month follow-up   History of Present Illness: Feels perhaps a little more normal  Still has lots of pain on the oxycodone for pain Using the oxycontin regularly but not needing the rescue meds as often  Saw nephrologist started enalapril held lasix for several days--- didn't notice any sig fluid changes  Checks sugars regularly Have dropped off the prednisone has gone down to 18 two times a day of insulin Never above 200 still with several crashes---discussed cutting back more  Breathing is okay walking with rollator--for security---esp with ongoing back pain No chest pain  Allergies: No Known Drug Allergies  Past History:  Past medical, surgical, family and social histories (including risk factors) reviewed for relevance to current acute and chronic problems.  Past Medical History: Reviewed history from 02/12/2008 and no changes required. Diabetes mellitus, type II--nephropathy, neuropathy, retinopathy, PVD Peripheral vascular disease Hypertension GERD/esophagitis Bladder cancer Basal cell carcinoma--R temple Chronic venous insufficiency Renal insufficiency  CONSULTANTS Dr Orson Slick  [phone] Dr Al Corpus Dr Guinevere Scarlet Dr Melinda Crutch [phone]  Past Surgical History: Reviewed history from 12/08/2009 and no changes required. EGD 08/00 TURBT (pedunculated)  ~1993 Left shoulder separation repair 1950's Left knee arthroscopy Hyacinth Meeker) 04/03 TURBT 09/04 ABIs abnormal 10/07 Appendectomy Clavicle repair Epidural abscess 12/10   [facility]  (medical Rx only) Stent left leg--Dr Earnestine Leys  Family History: Reviewed history from 08/13/2009 and no changes  required. Father died of heart disease No FH of Colon Cancer: History is remarkable for diabetes  Social History: Reviewed history from 06/13/2008 and no changes required. Widowed 4/07 Children: 1 son and 1 daughter Alcohol use-no Retired Patient is a former smoker.  Daily Caffeine Use  Review of Systems       eating okay has noted some fluid accumulation--mostly in left leg-------discussed using the lasix if this happens  Physical Exam  General:  alert.  NAD Neck:  supple, no masses, and no thyromegaly.   Lungs:  normal respiratory effort and normal breath sounds.   Heart:  normal rate, regular rhythm, no murmur, and no gallop.   Extremities:  trace edema in left foot Psych:  normally interactive, good eye contact, not anxious appearing, and not depressed appearing.     Impression & Recommendations:  Problem # 1:  DIABETES MELLITUS, TYPE II (ICD-250.00) Assessment Comment Only lower with prednisone off may need to reduce insulin further  His updated medication list for this problem includes:    Aspirin 81 Mg Tbec (Aspirin) .Marland Kitchen... Take one by mouth once a day    Novolog Mix 70/30 Flexpen 70-30 % Susp (Insulin aspart prot & aspart) ..... Inject 18 units subcutaneously two times a day and adjust as directed.    Glipizide 5 Mg Xr24h-tab (Glipizide) .Marland Kitchen... 1 daily before breakfast for diabetes    Enalapril Maleate 2.5 Mg Tabs (Enalapril maleate) .Marland Kitchen... Take 1 by mouth once daily  Problem # 2:  RENAL INSUFFICIENCY (ICD-588.9) Assessment: Comment Only back on enalapril urged him to hold off on celebrex if possible  Problem # 3:  HYPERTENSION (ICD-401.9) Assessment: Improved better back on  the enalapril  The following medications were removed from the medication list:    Clonidine Hcl 0.1 Mg/24hr Ptwk (Clonidine hcl) .Marland Kitchen... Apply 1 patch weekly for high blood pressure His updated medication list for this problem includes:    Norvasc 10 Mg Tabs (Amlodipine besylate) .Marland Kitchen...  Take one by mouth once a day    Furosemide 40 Mg Tabs (Furosemide) .Marland Kitchen... 1 tab daily as needed for increased leg swelling    Metoprolol Succinate 100 Mg Xr24h-tab (Metoprolol succinate) .Marland Kitchen... 1 tab daily for high blood pressure    Enalapril Maleate 2.5 Mg Tabs (Enalapril maleate) .Marland Kitchen... Take 1 by mouth once daily  BP today: 130/60 Prior BP: 170/60 (12/19/2009)  Labs Reviewed: K+: 3.9 (12/08/2009) Creat: : 1.4 (12/08/2009)   Chol: 194 (10/18/2008)   HDL: 54 (10/18/2008)   LDL: 85 (10/18/2008)   TG: 275 (10/18/2008)  Problem # 4:  PERIPHERAL VASCULAR DISEASE (ICD-443.9) Assessment: Comment Only Dr Al Corpus managing left foot lesion  Complete Medication List: 1)  Aspirin 81 Mg Tbec (Aspirin) .... Take one by mouth once a day 2)  Omeprazole 20 Mg Cpdr (Omeprazole) .... Take one by mouth once a day 3)  Norvasc 10 Mg Tabs (Amlodipine besylate) .... Take one by mouth once a day 4)  Vitamin E 400 Unit Caps (Vitamin e) .... Take one by mouth two times a day 5)  Cosamin Ds 500-400 Mg Tabs (Glucosamine-chondroitin) .... Take one by mouth once a day 6)  Novolog Mix 70/30 Flexpen 70-30 % Susp (Insulin aspart prot & aspart) .... Inject 18 units subcutaneously two times a day and adjust as directed. 7)  Furosemide 40 Mg Tabs (Furosemide) .Marland Kitchen.. 1 tab daily as needed for increased leg swelling 8)  Oxycontin 20 Mg Xr12h-tab (Oxycodone hcl) .Marland Kitchen.. 1 tab by mouth two times a day 9)  Oxycodone Hcl 5 Mg Tabs (Oxycodone hcl) .Marland Kitchen.. 1 by mouth every 4 hours as needed for pain 10)  Metoprolol Succinate 100 Mg Xr24h-tab (Metoprolol succinate) .Marland Kitchen.. 1 tab daily for high blood pressure 11)  Miralax Powd (Polyethylene glycol 3350) .Marland KitchenMarland Kitchen. 17 gm with water two times a day to keep bowels working 12)  Senokot S 8.6-50 Mg Tabs (Sennosides-docusate sodium) .... 2 tabs daily to prevent constipation 13)  Glipizide 5 Mg Xr24h-tab (Glipizide) .Marland Kitchen.. 1 daily before breakfast for diabetes 14)  Celebrex 400 Mg Caps (Celecoxib) .... Take 1 by  mouth once daily 15)  Ketoconazole 2 % Crea (Ketoconazole) .... Apply three times a day till rash clear 16)  Enalapril Maleate 2.5 Mg Tabs (Enalapril maleate) .... Take 1 by mouth once daily  Patient Instructions: 1)  Please try to do without the celebrex 2)  Reduce your insulin further if you have more low sugar reactions 3)  Please schedule a follow-up appointment in 3 months .   Current Allergies (reviewed today): No known allergies

## 2010-10-14 NOTE — Progress Notes (Signed)
Summary: refill request for oxycontin  Phone Note Refill Request Call back at Home Phone [phone] Message from:  Patient  Refills Requested: Medication #1:  OXYCONTIN 20 MG XR12H-TAB 1 tab by mouth two times a day Please call pt when ready.  Initial call taken by: Lowella Petties CMA,  April 06, 2010 9:21 AM  Follow-up for Phone Call        Rx written Follow-up by: Cindee Salt MD,  April 06, 2010 1:08 PM  Additional Follow-up for Phone Call Additional follow up Details #1::        Patient notified that rx is up front and ready for pickup. Additional Follow-up by: Sydell Axon LPN,  April 06, 2010 2:22 PM    Prescriptions: OXYCONTIN 20 MG XR12H-TAB (OXYCODONE HCL) 1 tab by mouth two times a day  #60 x 0   Entered and Authorized by:   Cindee Salt MD   Signed by:   Cindee Salt MD on 04/06/2010   Method used:   Print then Give to Patient   RxID:   [id number]

## 2010-10-14 NOTE — Letter (Signed)
Summary: [facility]   Imported By: Maryln Gottron 04/24/2010 13:53:42  _____________________________________________________________________  External Attachment:    Type:   Image     Comment:   External Document  Appended Document: [facility] CKD stage 3 is stable on enalapril

## 2010-10-14 NOTE — Letter (Signed)
Summary: [facility]   Imported By: Maryln Gottron 12/17/2009 13:53:33  _____________________________________________________________________  External Attachment:    Type:   Image     Comment:   External Document  Appended Document: [facility] continues on two times a day rocephin for lumbar discitis

## 2010-10-14 NOTE — Letter (Signed)
Summary: [facility]   Imported By: Maryln Gottron 01/15/2010 10:40:48  _____________________________________________________________________  External Attachment:    Type:   Image     Comment:   External Document  Appended Document: [facility] restarting enalapril 2.5mg  daily

## 2010-10-14 NOTE — Progress Notes (Signed)
Summary: Rx for yeast infection on leg  Phone Note Call from Patient Call back at Home Phone [phone]   Caller: Patient Call For: Cindee Salt MD Summary of Call: Patient called to see if Dr. Alphonsus Sias could write him a new Rx for the yeast infection on his leg.  Uses [facility]. Initial call taken by: Linde Gillis CMA Duncan Dull),  December 19, 2009 4:19 PM  Follow-up for Phone Call        I have not been looking at his leg---Dr Alison Murray has been treating this. Is this a rash in his groin, or down by his foot? Cordarro Spinnato Dia Crawford MD  December 21, 2009 3:01 PM   spoke with pt and he states it's in the "crease" of his leg between his thigh and groin, pt states it just came up in the last week. Please advise. DeShannon Smith CMA Duncan Dull)  December 22, 2009 9:52 AM   Additional Follow-up for Phone Call Additional follow up Details #1::        Okay to send Rx for ketoconazole cream to apply three times a day till rash clear  #60gm x 1 Cindee Salt MD  December 22, 2009 10:25 AM   Spoke with patient and advised results. Rx sent to pharmacy Additional Follow-up by: DeShannon Katrinka Blazing CMA Duncan Dull),  December 22, 2009 11:03 AM    New/Updated Medications: KETOCONAZOLE 2 % CREA (KETOCONAZOLE) apply three times a day till rash clear Prescriptions: KETOCONAZOLE 2 % CREA (KETOCONAZOLE) apply three times a day till rash clear  #60gm x 1   Entered by:   Mervin Hack CMA (AAMA)   Authorized by:   Cindee Salt MD   Signed by:   Mervin Hack CMA (AAMA) on 12/22/2009   Method used:   Electronically to        [facility] (retail)       [address]       [place], [place]  [id number]       Ph: [phone]       Fax: [phone]   RxID:   [id number]

## 2010-10-14 NOTE — Letter (Signed)
Summary: [facility]   Imported By: Lanelle Bal 08/05/2010 10:06:31  _____________________________________________________________________  External Attachment:    Type:   Image     Comment:   External Document  Appended Document: [facility] renal function stable on enalapril rechecking labs 6 month follow up

## 2010-10-14 NOTE — Letter (Signed)
Summary: [facility]  [facility]   Imported By: Lanelle Bal 12/17/2009 11:01:42  _____________________________________________________________________  External Attachment:    Type:   Image     Comment:   External Document

## 2010-10-14 NOTE — Progress Notes (Signed)
Summary: refill request for oxycontin  Phone Note Refill Request Call back at Home Phone [phone] Message from:  Patient  Refills Requested: Medication #1:  OXYCONTIN 20 MG XR12H-TAB 1 tab by mouth two times a day Please call pt when ready.  Initial call taken by: Lowella Petties CMA,  Feb 05, 2010 11:12 AM  Follow-up for Phone Call        Rx written Follow-up by: Cindee Salt MD,  Feb 05, 2010 1:08 PM  Additional Follow-up for Phone Call Additional follow up Details #1::        Spoke with patient and advised rx ready for pick-up  Additional Follow-up by: Mervin Hack CMA Duncan Dull),  Feb 05, 2010 3:18 PM    Prescriptions: OXYCONTIN 20 MG XR12H-TAB (OXYCODONE HCL) 1 tab by mouth two times a day  #60 x 0   Entered and Authorized by:   Cindee Salt MD   Signed by:   Cindee Salt MD on 02/05/2010   Method used:   Print then Give to Patient   RxID:   [id number]

## 2010-10-14 NOTE — Consult Note (Signed)
Summary: [facility]   Imported By: Lanelle Bal 02/24/2010 10:00:31  _____________________________________________________________________  External Attachment:    Type:   Image     Comment:   External Document  Appended Document: [facility] renal function stable stay off celebrex follow up 2 months

## 2010-10-14 NOTE — Letter (Signed)
Summary: [facility]   Imported By: Lester [place] 02/28/2010 11:08:34  _____________________________________________________________________  External Attachment:    Type:   Image     Comment:   External Document  Appended Document: [facility] planning 2-4 more months of levaquin for lumbar discitis

## 2010-10-14 NOTE — Miscellaneous (Signed)
Summary: [facility]  [facility]   Imported By: Lanelle Bal 01/29/2010 09:46:30  _____________________________________________________________________  External Attachment:    Type:   Image     Comment:   External Document

## 2010-10-14 NOTE — Letter (Signed)
Summary: [facility]   Imported By: Lanelle Bal 06/09/2010 08:45:10  _____________________________________________________________________  External Attachment:    Type:   Image     Comment:   External Document  Appended Document: [facility] may stop the levaquin for disciitis if inflammatory markers look good On augmentin from podiatrist and had some amputations of toes

## 2010-10-14 NOTE — Letter (Signed)
Summary: [facility]  [facility]   Imported By: Maryln Gottron 12/09/2009 10:54:50  _____________________________________________________________________  External Attachment:    Type:   Image     Comment:   External Document  Appended Document: [facility] doing well after left endovascular repair

## 2010-10-14 NOTE — Assessment & Plan Note (Signed)
Summary: 3 M F/U DLO   Vital Signs:  Patient profile:   77 year old male Weight:      204 pounds BMI:     27.01 Temp:     98.4 degrees F oral Pulse rate:   60 / minute Pulse rhythm:   regular BP sitting:   160 / 68  (left arm) Cuff size:   large  Vitals Entered By: Mervin Hack CMA Duncan Dull) (May 01, 2010 11:04 AM) CC: 3 month follow-up   History of Present Illness: Doing okay Still with ongoing pain Continues on the oxycontin but only taking once a day in AM Uses the as needed fairly rarely  Still on the levaquin and augmentin for the discitis  Had right great toe amputated recently--July was gangrenous Dr Gilda Crease did that Healing okay  Still checks sugars  Has had to decrease insulin to 7 units two times a day  Highest is 164 No recent hypoglycemia Due for eye exam--had to cancel in past due to illness  No chest pain No SOB  Allergies: No Known Drug Allergies  Past History:  Past medical, surgical, family and social histories (including risk factors) reviewed for relevance to current acute and chronic problems.  Past Medical History: Reviewed history from 02/12/2008 and no changes required. Diabetes mellitus, type II--nephropathy, neuropathy, retinopathy, PVD Peripheral vascular disease Hypertension GERD/esophagitis Bladder cancer Basal cell carcinoma--R temple Chronic venous insufficiency Renal insufficiency  CONSULTANTS Dr Orson Slick  [phone] Dr Al Corpus Dr Guinevere Scarlet Dr Melinda Crutch [phone]  Past Surgical History: EGD 08/00 TURBT (pedunculated)  ~1993 Left shoulder separation repair 1950's Left knee arthroscopy Hyacinth Meeker) 04/03 TURBT 09/04 ABIs abnormal 10/07 Appendectomy Clavicle repair Epidural abscess 12/10   [facility]  (medical Rx only) Stent left leg--Dr Hearn Cholecystectomy   5/11 Right great toe amputation  7/11 ([facility])  Family History: Reviewed history from 08/13/2009 and no changes required. Father died of heart disease No FH of Colon  Cancer: History is remarkable for diabetes  Social History: Reviewed history from 06/13/2008 and no changes required. Widowed 4/07 Children: 1 son and 1 daughter Alcohol use-no Retired Patient is a former smoker.  Daily Caffeine Use  Review of Systems       weight down 6# eating okay needs readjustments with hearing aid sleeping reasonably well Gets pain in back at times--"spine sticks out"  Physical Exam  General:  alert and normal appearance.   Neck:  supple, no masses, no thyromegaly, no carotid bruits, and no cervical lymphadenopathy.   Lungs:  normal respiratory effort, no intercostal retractions, no accessory muscle use, and normal breath sounds.   Heart:  normal rate, regular rhythm, no murmur, and no gallop.   Pulses:  faint at best Extremities:  1+ pitting edema in feet Psych:  normally interactive, good eye contact, not anxious appearing, and not depressed appearing.    Diabetes Management Exam:    Foot Exam (with socks and/or shoes not present):       Sensory-Pinprick/Light touch:          Left medial foot (L-4): absent          Left dorsal foot (L-5): absent          Left lateral foot (S-1): absent          Right medial foot (L-4): absent          Right dorsal foot (L-5): absent          Right lateral foot (S-1): absent  Inspection:          Left foot: abnormal             Comments: dressing on for persistent ulcer. Dr Al Corpus managing          Right foot: abnormal             Comments: great toe off. WOund clean and dry       Nails:          Left foot: fungal infection          Right foot: fungal infection   Impression & Recommendations:  Problem # 1:  DIABETES MELLITUS, TYPE II (ICD-250.00) Assessment Improved  better on lower dose would accept A1c up to even 95 to avoid hypoglycemia--labs today  His updated medication list for this problem includes:    Novolog Mix 70/30 Flexpen 70-30 % Susp (Insulin aspart prot & aspart) ..... Inject 7 units  subcutaneously two times a day and adjust as directed.    Glipizide 5 Mg Xr24h-tab (Glipizide) .Marland Kitchen... 1 daily before breakfast for diabetes    Enalapril Maleate 2.5 Mg Tabs (Enalapril maleate) .Marland Kitchen... Take 1 by mouth once daily    Aspirin 81 Mg Tbec (Aspirin) .Marland Kitchen... 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 Take one by mouth once a day    Furosemide 40 Mg Tabs (Furosemide) .Marland Kitchen... 1 tab daily as needed for increased leg swelling    Metoprolol Succinate 100 Mg Xr24h-tab (Metoprolol succinate) .Marland Kitchen... 1 tab daily for high blood pressure    Enalapril Maleate 2.5 Mg Tabs (Enalapril maleate) .Marland Kitchen... Take 1 by mouth once daily  BP today: 160/68 Prior BP: 130/60 (01/20/2010)  Labs Reviewed: K+: 3.9 (12/08/2009) Creat: : 1.4 (12/08/2009)   Chol: 194 (10/18/2008)   HDL: 54 (10/18/2008)   LDL: 85 (10/18/2008)   TG: 275 (10/18/2008)  Orders: Venipuncture (04540) TLB-Renal Function Panel (80069-RENAL) TLB-CBC Platelet - w/Differential (85025-CBCD) TLB-Hepatic/Liver Function Pnl (80076-HEPATIC) TLB-TSH (Thyroid Stimulating Hormone) (84443-TSH)  Problem # 4:  PERIPHERAL VASCULAR DISEASE (ICD-443.9) Assessment: Unchanged Dr Gilda Crease now following  Complete Medication List: 1)  Omeprazole 20 Mg Cpdr (Omeprazole) .... Take one by mouth once a day 2)  Norvasc 10 Mg Tabs (Amlodipine besylate) .... Take one by mouth once a day 3)  Novolog Mix 70/30 Flexpen 70-30 %  Susp (Insulin aspart prot & aspart) .... Inject 7 units subcutaneously two times a day and adjust as directed. 4)  Furosemide 40 Mg Tabs (Furosemide) .Marland Kitchen.. 1 tab daily as needed for increased leg swelling 5)  Oxycontin 20 Mg Xr12h-tab (Oxycodone hcl) .Marland Kitchen.. 1 tab by mouth two times a day 6)  Oxycodone Hcl 5 Mg Tabs (Oxycodone hcl) .Marland Kitchen.. 1 by mouth every 4 hours as needed for pain 7)  Metoprolol Succinate 100 Mg Xr24h-tab (Metoprolol succinate) .Marland Kitchen.. 1 tab daily for high blood pressure 8)  Glipizide 5 Mg Xr24h-tab (Glipizide) .Marland Kitchen.. 1 daily before breakfast for diabetes 9)  Enalapril Maleate 2.5 Mg Tabs (Enalapril maleate) .... Take 1  by mouth once daily 10)  Novofine 30g X 8 Mm Misc (Insulin pen needle) .... As directed. 11)  Amoxicillin-pot Clavulanate 875-125 Mg Tabs (Amoxicillin-pot clavulanate) .... Take 1 by mouth once daily 12)  Aspirin 81 Mg Tbec (Aspirin) .... Take one by mouth once a day 13)  Vitamin E 400 Unit Caps (Vitamin e) .... Take one by mouth two times a day 14)  Cosamin Ds 500-400 Mg Tabs (Glucosamine-chondroitin) .... Take one by mouth once a day  Patient Instructions: 1)  Please schedule a follow-up appointment in 4 months .   Current Allergies (reviewed today): No known allergies

## 2010-10-15 NOTE — Assessment & Plan Note (Signed)
Summary: 4 M F/U DLO   Vital Signs:  Patient profile:   77 year old male Weight:      212 pounds Temp:     98.5 degrees F oral Pulse rate:   60 / minute Pulse rhythm:   regular BP sitting:   168 / 78  (left arm) Cuff size:   large  Vitals Entered By: Mervin Hack CMA Duncan Dull) (August 31, 2010 12:16 PM) CC: 4 month follow-up   History of Present Illness: Slow improvement Getting back some strength but still not back to normal can walk with cane---tires out easy (after 100 feet or so)  Was finally able to shower for the first time since the toe amputation Foot looks okay---new shoes on order  Stopped the oxycontin and oxycodone no pain problems now  going to [place] tomorrow to visit wht grandkids Staying till January 3rd  Has some new spots on his forehead that need attention One did fall off but other persists  Checks sugars most days--but occ forgets at times still plans two times a day checks and occ forgets the second dose increased insulin base from 7-10 due to running higher No hypoglycemic reactions  Still on the augmentin for the foot no longer on antibiotic for discitis  Dr Cherylann Ratel still monitors proteinuria down to 6 month follow up  No chest pain No SOB  Allergies: No Known Drug Allergies  Past History:  Past medical, surgical, family and social histories (including risk factors) reviewed for relevance to current acute and chronic problems.  Past Medical History: Reviewed history from 02/12/2008 and no changes required. Diabetes mellitus, type II--nephropathy, neuropathy, retinopathy, PVD Peripheral vascular disease Hypertension GERD/esophagitis Bladder cancer Basal cell carcinoma--R temple Chronic venous insufficiency Renal insufficiency  [facility]ULT[facility] Dr Orson Slick  [phone] Dr Al Corpus Dr Guinevere Scarlet Dr Melinda Crutch [phone]  Past Surgical History: Reviewed history from 05/01/2010 and no changes required. EGD 08/00 TURBT (pedunculated)   ~1993 Left shoulder separation repair 1950's Left knee arthroscopy Hyacinth Meeker) 04/03 TURBT 09/04 ABIs abnormal 10/07 Appendectomy Clavicle repair Epidural abscess 12/10   [facility]  (medical Rx only) Stent left leg--Dr Hearn Cholecystectomy   5/11 Right great toe amputation  7/11 ([facility])  Family History: Reviewed history from 08/13/2009 and no changes required. Father died of heart disease No FH of Colon Cancer: History is remarkable for diabetes  Social History: Reviewed history from 06/13/2008 and no changes required. Widowed 4/07 Children: 1 son and 1 daughter Alcohol use-no Retired Patient is a former smoker.  Daily Caffeine Use  Review of Systems       eating better has gained 8# sleeps okay --has still been in chair. Plans to restart using bed for sleep generally voids okay  Physical Exam  General:  alert.  NAD Neck:  supple, no masses, no thyromegaly, and no cervical lymphadenopathy.   Lungs:  normal respiratory effort, no intercostal retractions, no accessory muscle use, and normal breath sounds.   Heart:  normal rate, regular rhythm, no murmur, and no gallop.   Abdomen:  soft and non-tender.   Extremities:  2+ non pitting edema Skin:  2 actinics on forehead--1 on right and 1 on left Psych:  normally interactive, good eye contact, not anxious appearing, and not depressed appearing.     Impression & Recommendations:  Problem # 1:  DIABETES MELLITUS, TYPE II (ICD-250.00) Assessment Deteriorated  has needed to increase insulin control has been tight though so this should be okay  His updated medication list for this problem includes:  Novolog Mix 70/30 Flexpen 70-30 % Susp (Insulin aspart prot & aspart) ..... Inject 10 units subcutaneously two times a day and adjust as directed.    Glipizide 5 Mg Xr24h-tab (Glipizide) .Marland Kitchen... 1 daily before breakfast for diabetes    Enalapril Maleate 2.5 Mg Tabs (Enalapril maleate) .Marland Kitchen... Take 1 by mouth once daily    Aspirin 81  Mg Tbec (Aspirin) .Marland Kitchen... Take one by mouth once a day  Labs Reviewed: Creat: 1.2 (05/01/2010)     Last Eye Exam: retinopathy stable (10/16/2008) Reviewed HgBA1c results: 6.0 (05/01/2010)  6.7 (12/08/2009)  Orders: Venipuncture (16109) TLB-A1C / Hgb A1C (Glycohemoglobin) (83036-A1C)  Problem # 2:  HYPERTENSION (ICD-401.9) Assessment: Unchanged runs a bit high no changes for now due to renal issues consider increasing the enalapril  His updated medication list for this problem includes:    Norvasc 10 Mg Tabs (Amlodipine besylate) .Marland Kitchen... Take one by mouth once a day    Furosemide 40 Mg Tabs (Furosemide) .Marland Kitchen... 1 tab daily as needed for increased leg swelling    Metoprolol Succinate 100 Mg Xr24h-tab (Metoprolol succinate) .Marland Kitchen... 1 tab daily for high blood pressure    Enalapril Maleate 2.5 Mg Tabs (Enalapril maleate) .Marland Kitchen... Take 1 by mouth once daily  BP today: 168/78 Prior BP: 160/68 (05/01/2010)  Labs Reviewed: K+: 4.7 (05/01/2010) Creat: : 1.2 (05/01/2010)   Chol: 194 (10/18/2008)   HDL: 54 (10/18/2008)   LDL: 85 (10/18/2008)   TG: 275 (10/18/2008)  Problem # 3:  VENOUS INSUFFICIENCY (ICD-459.81) Assessment: Deteriorated slightly increased edema uses the furosemide 4 days per week  Problem # 4:  ACTINIC KERATOSIS (ICD-702.0) Assessment: Comment Only  2 lesions on forehead treated with liquid nitrogen---40 seconds x 2 tolerated well  Orders: Cryotherapy/Destruction benign or premalignant lesion (1st lesion)  (17000) Cryotherapy/Destruction benign or premalignant lesion (2nd-14th lesions) (17003)  Problem # 5:  PERIPHERAL VASCULAR DISEASE (ICD-443.9) Assessment: Comment Only Dr Al Corpus managing feet  Complete Medication List: 1)  Omeprazole 20 Mg Cpdr (Omeprazole) .... Take one by mouth once a day 2)  Norvasc 10 Mg Tabs (Amlodipine besylate) .... Take one by mouth once a day 3)  Novolog Mix 70/30 Flexpen 70-30 % Susp (Insulin aspart prot & aspart) .... Inject 10 units  subcutaneously two times a day and adjust as directed. 4)  Furosemide 40 Mg Tabs (Furosemide) .Marland Kitchen.. 1 tab daily as needed for increased leg swelling 5)  Metoprolol Succinate 100 Mg Xr24h-tab (Metoprolol succinate) .Marland Kitchen.. 1 tab daily for high blood pressure 6)  Glipizide 5 Mg Xr24h-tab (Glipizide) .Marland Kitchen.. 1 daily before breakfast for diabetes 7)  Enalapril Maleate 2.5 Mg Tabs (Enalapril maleate) .... Take 1 by mouth once daily 8)  Novofine 30g X 8 Mm Misc (Insulin pen needle) .... As directed. 9)  Amoxicillin-pot Clavulanate 875-125 Mg Tabs (Amoxicillin-pot clavulanate) .... Take 1 by mouth once daily 10)  Aspirin 81 Mg Tbec (Aspirin) .... Take one by mouth once a day 11)  Vitamin E 400 Unit Caps (Vitamin e) .... Take one by mouth two times a day 12)  Cosamin Ds 500-400 Mg Tabs (Glucosamine-chondroitin) .... Take one by mouth once a day  Patient Instructions: 1)  Please schedule a follow-up appointment in 4 months .    Orders Added: 1)  Est. Patient Level IV [60454] 2)  Cryotherapy/Destruction benign or premalignant lesion (1st lesion)  [17000] 3)  Cryotherapy/Destruction benign or premalignant lesion (2nd-14th lesions) [17003] 4)  Venipuncture [36415] 5)  TLB-A1C / Hgb A1C (Glycohemoglobin) [83036-A1C]    Current Allergies (  reviewed today): No known allergies

## 2010-10-15 NOTE — Medication Information (Signed)
Summary: Diabetic Shoes/[facility]  Diabetic Shoes/[facility]   Imported By: Lanelle Bal 08/29/2010 10:13:01  _____________________________________________________________________  External Attachment:    Type:   Image     Comment:   External Document

## 2010-11-16 ENCOUNTER — Encounter: Payer: Self-pay | Admitting: Internal Medicine

## 2010-12-15 ENCOUNTER — Other Ambulatory Visit: Payer: Self-pay | Admitting: *Deleted

## 2010-12-15 MED ORDER — INSULIN ASPART PROT & ASPART (70-30 MIX) 100 UNIT/ML ~~LOC~~ SUSP: SUBCUTANEOUS | Status: DC

## 2011-01-01 ENCOUNTER — Encounter: Payer: Self-pay | Admitting: Internal Medicine

## 2011-01-01 ENCOUNTER — Ambulatory Visit (INDEPENDENT_AMBULATORY_CARE_PROVIDER_SITE_OTHER): Payer: Medicare Other | Admitting: Internal Medicine

## 2011-01-01 VITALS — BP 158/70 | HR 54 | Temp 98.7°F | Ht 73.0 in | Wt 229.0 lb

## 2011-01-01 DIAGNOSIS — I739 Peripheral vascular disease, unspecified: Secondary | ICD-10-CM

## 2011-01-01 DIAGNOSIS — I10 Essential (primary) hypertension: Secondary | ICD-10-CM

## 2011-01-01 DIAGNOSIS — E119 Type 2 diabetes mellitus without complications: Secondary | ICD-10-CM

## 2011-01-01 DIAGNOSIS — I872 Venous insufficiency (chronic) (peripheral): Secondary | ICD-10-CM

## 2011-01-01 DIAGNOSIS — K219 Gastro-esophageal reflux disease without esophagitis: Secondary | ICD-10-CM

## 2011-01-01 LAB — HEPATIC FUNCTION PANEL
ALT: 16 U/L (ref 0–53)
Total Protein: 6.4 g/dL (ref 6.0–8.3)

## 2011-01-01 LAB — CBC WITH DIFFERENTIAL/PLATELET
Basophils Relative: 0.4 % (ref 0.0–3.0)
Eosinophils Relative: 1.9 % (ref 0.0–5.0)
HCT: 38.2 % — ABNORMAL LOW (ref 39.0–52.0)
Hemoglobin: 12.9 g/dL — ABNORMAL LOW (ref 13.0–17.0)
Lymphs Abs: 2.5 10*3/uL (ref 0.7–4.0)
Monocytes Relative: 8 % (ref 3.0–12.0)
Neutro Abs: 6 10*3/uL (ref 1.4–7.7)
RBC: 4.28 Mil/uL (ref 4.22–5.81)
WBC: 9.5 10*3/uL (ref 4.5–10.5)

## 2011-01-01 LAB — BASIC METABOLIC PANEL
GFR: 50.59 mL/min — ABNORMAL LOW (ref >60.00)
Potassium: 5.1 mEq/L (ref 3.5–5.1)
Sodium: 142 mEq/L (ref 135–145)

## 2011-01-01 NOTE — Progress Notes (Signed)
Subjective:    Patient ID: [name] [name], male    DOB: December 25, 1925, 77 y.o.   MRN: [id number]  HPI DOing fair Has a spot on his right leg that he needs checked  Getting some headaches of late Has a mild "easy" pain at first---generally could "think it away" in the past Never progresses now but is occurring more often Doesn't need meds  Doesn't check BP No chest pain No SOB  Done with Dr Blocker No more problems with discitis Has lost 3" in height---may be more posture Still sees podiatrist regularly---may have mild infection at this point in right heel  Checks sugars twice a day Forgets his insulin more regularly--because he has to take it only just before he eats Discussed not checking sugars till his food is ready--then taking the insulin No hypoglycemic spells  Current outpatient prescriptions:amLODipine (NORVASC) 10 MG tablet, Take 10 mg by mouth daily.  , Disp: , Rfl: ;  aspirin 81 MG tablet, Take 81 mg by mouth daily.  , Disp: , Rfl: ;  enalapril (VASOTEC) 2.5 MG tablet, Take 2.5 mg by mouth daily.  , Disp: , Rfl: ;  furosemide (LASIX) 40 MG tablet, Take 40 mg by mouth daily as needed.  , Disp: , Rfl: ;  glipiZIDE (GLUCOTROL) 5 MG 24 hr tablet, Take 5 mg by mouth daily before breakfast.  , Disp: , Rfl:  glucosamine-chondroitin 500-400 MG tablet, Take 1 tablet by mouth daily.  , Disp: , Rfl: ;  insulin aspart protamine-insulin aspart (NOVOLOG 70/30) (70-30) 100 UNIT/ML injection, Inject 14 units [place] two times a day and adjust as directed , Disp: , Rfl: ;  Insulin Pen Needle (NOVOFINE) 30G X 8 MM MISC, Inject 1 packet into the skin as needed.  , Disp: , Rfl: ;  metoprolol (TOPROL-XL) 100 MG 24 hr tablet, Take 100 mg by mouth daily.  , Disp: , Rfl:  Multiple Vitamins-Minerals (ONE-A-DAY 50 PLUS PO), Take by mouth daily.  , Disp: , Rfl: ;  omeprazole (PRILOSEC) 20 MG capsule, Take 20 mg by mouth daily.  , Disp: , Rfl: ;  vitamin E 400 UNIT capsule, Take 400 Units by mouth 2 (two)  times daily.  , Disp: , Rfl: ;  DISCONTD: insulin aspart protamine-insulin aspart (NOVOLOG 70/30) (70-30) 100 UNIT/ML injection, Inject 33 units [place] two times a day and adjust as directed, Disp: 30 mL, Rfl: 11 DISCONTD: amoxicillin-clavulanate (AUGMENTIN) 875-125 MG per tablet, Take 1 tablet by mouth daily.  , Disp: , Rfl:   Past Medical History  Diagnosis Date  . Diabetes mellitus     nephropathy, neuropathy, retinopathy, PVD  . PVD (peripheral vascular disease)   . Hypertension   . GERD (gastroesophageal reflux disease)   . Bladder cancer   . Basal cell carcinoma     right temple  . Chronic venous insufficiency   . Renal insufficiency     Past Surgical History  Procedure Date  . Esophagogastroduodenoscopy 04/1999  . Shoulder arthroscopy w/ acromial repair 1950    left shoulder seperation repair  . Knee arthroscopy 12/2001    Dr.Miller  . Appendectomy   . Shoulder arthroscopy distal clavicle excision and open rotator cuff repair   . Carotid stent     left leg, Dr. Earnestine Leys  . Cholecystectomy 01/2010  . Toe amputation 03/2010    right great toe, Dr. Gilda Crease    Family History  Problem Relation Age of Onset  . Heart disease Father   . Cancer Neg  Hx     colon cancer, no history    History   Social History  . Marital Status: Widowed    Spouse Name: N/A    Number of Children: 2  . Years of Education: N/A   Occupational History  . retired    Social History Main Topics  . Smoking status: Former Games developer  . Smokeless tobacco: Not on file  . Alcohol Use: No  . Drug Use: Not on file  . Sexually Active: Not on file   Other Topics Concern  . Not on file   Social History Narrative   Daily caffeine use   Review of Systems Appetite is okay Has gained back a few more pounds Sleeps okay--gets up 3-4AM for nocturia----then moves to his chair (usually goes back to sleep)    Objective:   Physical Exam  Constitutional: He appears well-developed and well-nourished. No  distress.  Neck: Normal range of motion. Neck supple.  Cardiovascular: Normal rate, regular rhythm and normal heart sounds.  Exam reveals no gallop.   No murmur heard.      Faint pedal pulses  Pulmonary/Chest: Effort normal and breath sounds normal. No respiratory distress. He has no wheezes. He has no rales.  Abdominal: Soft. There is no tenderness.  Musculoskeletal: He exhibits edema.       1+ edema in right foot  Lymphadenopathy:    He has no cervical adenopathy.  Skin:       ~2cm diameter blanching red spot on mid left thigh ?central area like bite Reassured---looks benign  Left heel covered No other lesions  Psychiatric: He has a normal mood and affect. His behavior is normal. Judgment and thought content normal.          Assessment & Plan:

## 2011-01-06 ENCOUNTER — Other Ambulatory Visit: Payer: Self-pay | Admitting: *Deleted

## 2011-01-06 MED ORDER — GLIPIZIDE ER 5 MG PO TB24: 5.0000 mg | ORAL_TABLET | Freq: Every day | ORAL | Status: DC

## 2011-01-06 NOTE — Telephone Encounter (Signed)
rx sent to pharmacy

## 2011-01-07 ENCOUNTER — Encounter: Payer: Self-pay | Admitting: *Deleted

## 2011-01-18 ENCOUNTER — Other Ambulatory Visit: Payer: Self-pay | Admitting: *Deleted

## 2011-01-18 MED ORDER — METOPROLOL SUCCINATE ER 100 MG PO TB24: 100.0000 mg | ORAL_TABLET | Freq: Every day | ORAL | Status: DC

## 2011-02-15 ENCOUNTER — Other Ambulatory Visit: Payer: Self-pay | Admitting: *Deleted

## 2011-02-15 MED ORDER — OMEPRAZOLE 20 MG PO CPDR: 20.0000 mg | DELAYED_RELEASE_CAPSULE | Freq: Every day | ORAL | Status: DC

## 2011-03-15 ENCOUNTER — Other Ambulatory Visit: Payer: Self-pay | Admitting: *Deleted

## 2011-03-15 MED ORDER — INSULIN PEN NEEDLE 30G X 8 MM MISC: 1.0000 | Status: DC | PRN

## 2011-04-05 ENCOUNTER — Ambulatory Visit (INDEPENDENT_AMBULATORY_CARE_PROVIDER_SITE_OTHER): Payer: Medicare Other | Admitting: Internal Medicine

## 2011-04-05 ENCOUNTER — Encounter: Payer: Self-pay | Admitting: Internal Medicine

## 2011-04-05 DIAGNOSIS — H60399 Other infective otitis externa, unspecified ear: Secondary | ICD-10-CM

## 2011-04-05 DIAGNOSIS — H6090 Unspecified otitis externa, unspecified ear: Secondary | ICD-10-CM

## 2011-04-05 DIAGNOSIS — R6884 Jaw pain: Secondary | ICD-10-CM | POA: Insufficient documentation

## 2011-04-05 MED ORDER — NEOMYCIN-POLYMYXIN-HC 3.5-10000-1 OT SUSP: 4.0000 [drp] | Freq: Four times a day (QID) | OTIC | Status: AC

## 2011-04-05 NOTE — Progress Notes (Signed)
Subjective:    Patient ID: [name] [name], male    DOB: 13-Apr-1926, 77 y.o.   MRN: [id number]  HPI Having right jaw pain--started 2 days ago Bad pain when he bites down on food Having pain in right ear when lying in bed on that ear (or at TMJ)  Saw dentist last week X-rays looked okay (was having just a touch of pain then)  No headache No vision loss No pounding feeling in head  No known injury to jaw  Current Outpatient Prescriptions on File Prior to Visit  Medication Sig Dispense Refill  . amLODipine (NORVASC) 10 MG tablet Take 10 mg by mouth daily.        Marland Kitchen aspirin 81 MG tablet Take 81 mg by mouth daily.        . enalapril (VASOTEC) 2.5 MG tablet Take 2.5 mg by mouth daily.        . furosemide (LASIX) 40 MG tablet Take 40 mg by mouth daily as needed.        Marland Kitchen glipiZIDE (GLUCOTROL) 5 MG 24 hr tablet Take 1 tablet (5 mg total) by mouth daily before breakfast.  30 tablet  11  . glucosamine-chondroitin 500-400 MG tablet Take 1 tablet by mouth daily.        . insulin aspart protamine-insulin aspart (NOVOLOG 70/30) (70-30) 100 UNIT/ML injection Inject 14 units [place] two times a day and adjust as directed       . Insulin Pen Needle (NOVOFINE) 30G X 8 MM MISC Inject 10 each into the skin as needed.  100 each  11  . metoprolol (TOPROL-XL) 100 MG 24 hr tablet Take 1 tablet (100 mg total) by mouth daily.  30 tablet  11  . Multiple Vitamins-Minerals (ONE-A-DAY 50 PLUS PO) Take by mouth daily.        Marland Kitchen omeprazole (PRILOSEC) 20 MG capsule Take 1 capsule (20 mg total) by mouth daily.  30 capsule  10  . vitamin E 400 UNIT capsule Take 400 Units by mouth 2 (two) times daily.          No Known Allergies  Past Medical History  Diagnosis Date  . Diabetes mellitus     nephropathy, neuropathy, retinopathy, PVD  . PVD (peripheral vascular disease)   . Hypertension   . GERD (gastroesophageal reflux disease)   . Bladder cancer   . Basal cell carcinoma     right temple  . Chronic venous  insufficiency   . Renal insufficiency     Past Surgical History  Procedure Date  . Esophagogastroduodenoscopy 04/1999  . Shoulder arthroscopy w/ acromial repair 1950    left shoulder seperation repair  . Knee arthroscopy 12/2001    Dr.Miller  . Appendectomy   . Shoulder arthroscopy distal clavicle excision and open rotator cuff repair   . Carotid stent     left leg, Dr. Earnestine Leys  . Cholecystectomy 01/2010  . Toe amputation 03/2010    right great toe, Dr. Gilda Crease    Family History  Problem Relation Age of Onset  . Heart disease Father   . Cancer Neg Hx     colon cancer, no history    History   Social History  . Marital Status: Widowed    Spouse Name: N/A    Number of Children: 2  . Years of Education: N/A   Occupational History  . retired    Social History Main Topics  . Smoking status: Former Games developer  . Smokeless tobacco: Never Used  .  Alcohol Use: No  . Drug Use: Not on file  . Sexually Active: Not on file   Other Topics Concern  . Not on file   Social History Narrative   Daily caffeine use      Review of Systems No fever No apparent tooth issues on dental exam Some rhinorrhea but no sig congestion    Objective:   Physical Exam  Constitutional: He appears well-developed and well-nourished. No distress.  HENT:  Head: Normocephalic and atraumatic.       No temporal bruits Some pain with palpation just below TMJ (along mandible)  No tooth abnormalities No palpable findings in mouth  Right pinna is normal Some tragal tenderness and pain with speculum Mild swelling and inflammation in canal  Neck: Normal range of motion. Neck supple.  Lymphadenopathy:    He has no cervical adenopathy.          Assessment & Plan:

## 2011-04-05 NOTE — Assessment & Plan Note (Signed)
Slowly building and bad in the past couple of days No apparent dental reason ??could be touch of TMJ but not clear cut on exam--discussed trying warm compresses

## 2011-04-05 NOTE — Patient Instructions (Addendum)
Please try the ear drops, warm compresses on your jaw and tylenol as needed for the pain (500-625mg  every 4-6 hours as needed)

## 2011-04-05 NOTE — Assessment & Plan Note (Signed)
May be the reason for the jaw pain Will try cortisporin drops

## 2011-04-08 ENCOUNTER — Ambulatory Visit (INDEPENDENT_AMBULATORY_CARE_PROVIDER_SITE_OTHER): Payer: Medicare Other | Admitting: Family Medicine

## 2011-04-08 ENCOUNTER — Encounter: Payer: Self-pay | Admitting: Family Medicine

## 2011-04-08 DIAGNOSIS — H6090 Unspecified otitis externa, unspecified ear: Secondary | ICD-10-CM

## 2011-04-08 DIAGNOSIS — H60399 Other infective otitis externa, unspecified ear: Secondary | ICD-10-CM

## 2011-04-08 MED ORDER — GLUCOSAMINE-CHONDROITIN 500-400 MG PO TABS: ORAL_TABLET | ORAL | Status: DC

## 2011-04-08 MED ORDER — INSULIN PEN NEEDLE 30G X 8 MM MISC: Status: DC

## 2011-04-08 NOTE — Assessment & Plan Note (Signed)
Pt wasn't getting the drops deep into the canal.  We went over this.  I wouldn't consider this a treatment failure on this medicine.  Continue the cortisporin and then call back if not improved.  He may need ENT eval at that point.  He agrees.  Nontoxic.  Mastoid not ttp.

## 2011-04-08 NOTE — Progress Notes (Signed)
Was seen for otitis externa earlier.  Pain on the outer portion of the ear is better on cortisporin.  Now with trouble hearing in B ears, this is new.  R ear > L ear hearing change.  Only used drops in R ear, but he wasn't getting them down in the canal.  Feeling at baseline o/w.  No FCNAV.  Jaw pain is better, not resolved, but improved.  No sig recent change in average sugars.    Meds, vitals, and allergies reviewed.   ROS: See HPI.  Otherwise, noncontributory.  GEN: nad, alert and oriented HEENT: mucous membranes moist, pinna not ttp x2, L canal and TM wnl.  R canal obscured by purulent material, but it doesn't appear to need a wick.  NECK: supple w/o LA

## 2011-04-08 NOTE — Patient Instructions (Addendum)
Keep using the drops.  4 drops 4 times a day.  Get the drops all the way down in the canal.  Leave the drops in for about 5 minutes.   If you aren't getting better by next week, call the clinic so we can set you up with ENT.  You can wear the left hearing aid, but no the right one.  Take care.

## 2011-04-09 ENCOUNTER — Other Ambulatory Visit: Payer: Self-pay | Admitting: *Deleted

## 2011-04-09 MED ORDER — AMLODIPINE BESYLATE 10 MG PO TABS: 10.0000 mg | ORAL_TABLET | Freq: Every day | ORAL | Status: DC

## 2011-04-09 NOTE — Telephone Encounter (Signed)
rx sent to pharmacy by e-script  

## 2011-04-12 ENCOUNTER — Other Ambulatory Visit: Payer: Self-pay | Admitting: *Deleted

## 2011-04-12 MED ORDER — FUROSEMIDE 40 MG PO TABS: 40.0000 mg | ORAL_TABLET | Freq: Every day | ORAL | Status: DC | PRN

## 2011-05-12 ENCOUNTER — Other Ambulatory Visit: Payer: Self-pay | Admitting: Internal Medicine

## 2011-05-12 MED ORDER — ENALAPRIL MALEATE 20 MG PO TABS: 20.0000 mg | ORAL_TABLET | Freq: Two times a day (BID) | ORAL | Status: DC

## 2011-05-12 NOTE — Progress Notes (Signed)
Just updating enalapril dose from Dr Garnett [place] note Now 20mg  bid No Rx sent today

## 2011-05-19 ENCOUNTER — Telehealth: Payer: Self-pay | Admitting: *Deleted

## 2011-05-19 NOTE — Telephone Encounter (Signed)
If it's been a month, I'd like to recheck him.  See if he can come in on Thursday.  If not, I'll refer him.  Thanks.  Please let me know.

## 2011-05-19 NOTE — Telephone Encounter (Signed)
Pt was seen on 7/26 for an ear problem.  He says he still feels like he has fluid in his ear.  You had mentioned in your note that you would refer to ENT if he wasn't any better.  If referred, pt prefers to see someone in [place].

## 2011-05-20 NOTE — Telephone Encounter (Signed)
Patient advised.  Dr. Algis Downs is taking appts day by day until his baby arrives.  I will phone the patient in the a.m to schedule appt.

## 2011-05-21 ENCOUNTER — Ambulatory Visit (INDEPENDENT_AMBULATORY_CARE_PROVIDER_SITE_OTHER): Payer: Medicare Other | Admitting: Family Medicine

## 2011-05-21 ENCOUNTER — Encounter: Payer: Self-pay | Admitting: Family Medicine

## 2011-05-21 VITALS — BP 140/84 | HR 67 | Temp 98.0°F | Wt 229.0 lb

## 2011-05-21 DIAGNOSIS — H6980 Other specified disorders of Eustachian tube, unspecified ear: Secondary | ICD-10-CM | POA: Insufficient documentation

## 2011-05-21 MED ORDER — FLUTICASONE PROPIONATE 50 MCG/ACT NA SUSP: 1.0000 | Freq: Every day | NASAL | Status: DC

## 2011-05-21 NOTE — Assessment & Plan Note (Signed)
Otitis externa appears resolved.  ETD noted, will use nasal saline for now and then add on nasal steroid if not improved.  He'll monitor sugars- he knows it may increase with flonase.  If not resolved, then call back and we'll set up ENT eval.  He agrees with plan.

## 2011-05-21 NOTE — Progress Notes (Signed)
Seen 04/08/11 with otitis externa.  Pain is better but it still feels some fluid in the ears, R>L.  Hearing is off from baseline.  No FCNAV.  Sugar has been 'high' but usually <200.  He's missed a few shots.  If he takes the shot and eats on a regular schedule, he does better per report.    Meds, vitals, and allergies reviewed.   ROS: See HPI.  Otherwise, noncontributory.  nad ncat Hard of hearing, at baseline R pinna with flaky lesion noted (he has f/u with derm scheduled) Canal w/o purulent discharge x2 TM w/o erythema x2 Sluggish TM movement on L and no movement on R with valsalva.   Nasal exam slightly stuffy OP w/o erythema

## 2011-05-21 NOTE — Patient Instructions (Signed)
Use nasal saline in each nostril two or three times a day.  If not improved, then add on flonase- 1 spray in each nostril once a day.  If your sugar goes up a lot with that, then stop the flonase.  If you still aren't better, call and we'll set you up with ENT.  Take care.

## 2011-07-05 ENCOUNTER — Encounter: Payer: Self-pay | Admitting: Internal Medicine

## 2011-07-05 ENCOUNTER — Ambulatory Visit (INDEPENDENT_AMBULATORY_CARE_PROVIDER_SITE_OTHER): Payer: Medicare Other | Admitting: Internal Medicine

## 2011-07-05 VITALS — BP 128/67 | HR 118 | Ht 72.0 in | Wt 229.0 lb

## 2011-07-05 DIAGNOSIS — Z23 Encounter for immunization: Secondary | ICD-10-CM

## 2011-07-05 DIAGNOSIS — E119 Type 2 diabetes mellitus without complications: Secondary | ICD-10-CM

## 2011-07-05 DIAGNOSIS — I739 Peripheral vascular disease, unspecified: Secondary | ICD-10-CM

## 2011-07-05 DIAGNOSIS — N259 Disorder resulting from impaired renal tubular function, unspecified: Secondary | ICD-10-CM

## 2011-07-05 DIAGNOSIS — I10 Essential (primary) hypertension: Secondary | ICD-10-CM

## 2011-07-05 MED ORDER — INSULIN GLARGINE 100 UNIT/ML ~~LOC~~ SOLN: 40.0000 [IU] | Freq: Every day | SUBCUTANEOUS | Status: DC

## 2011-07-05 NOTE — Progress Notes (Signed)
Subjective:    Patient ID: [name] [name], male    DOB: 19-Mar-1926, 77 y.o.   MRN: [id number]  HPI Concerned about vitamin D Takes multivitamin with D and drinks lots of milk Dr Cherylann Ratel prescribed extra vitamin D  Concerned about blood pressure Hasn't been able to check at home Was okay now High there in August and enalapril increased to bid Apparently Dr Cherylann Ratel later prescribed hydralazine but he didn't start it Discussed that BP is okay now  Sugars have been 200 in AM 100-120 in the evening Has been checking bid still Still forgets at least once a week  No chest pain No SOB Does note easy fatigue ---if prolonged standing or walking several hundred feet. Better with rest Feet are okay  Current Outpatient Prescriptions on File Prior to Visit  Medication Sig Dispense Refill  . amLODipine (NORVASC) 10 MG tablet Take 1 tablet (10 mg total) by mouth daily.  30 tablet  11  . aspirin 81 MG tablet Take 81 mg by mouth daily.        . enalapril (VASOTEC) 20 MG tablet Take 1 tablet (20 mg total) by mouth 2 (two) times daily.  60 tablet  0  . furosemide (LASIX) 40 MG tablet Take 1 tablet (40 mg total) by mouth daily as needed.  30 tablet  6  . glipiZIDE (GLUCOTROL) 5 MG 24 hr tablet Take 1 tablet (5 mg total) by mouth daily before breakfast.  30 tablet  11  . glucosamine-chondroitin 500-400 MG tablet Take 1 tablet by mouth daily.        . insulin aspart protamine-insulin aspart (NOVOLOG 70/30) (70-30) 100 UNIT/ML injection Inject 14 units [place] two times a day and adjust as directed       . Insulin Pen Needle (NOVOFINE) 30G X 8 MM MISC Inject 14 units as directed  100 each  11  . metoprolol (TOPROL-XL) 100 MG 24 hr tablet Take 1 tablet (100 mg total) by mouth daily.  30 tablet  11  . Multiple Vitamins-Minerals (ONE-A-DAY 50 PLUS PO) Take by mouth daily.        Marland Kitchen omeprazole (PRILOSEC) 20 MG capsule Take 1 capsule (20 mg total) by mouth daily.  30 capsule  10  . vitamin E 400 UNIT capsule  Take 400 Units by mouth 2 (two) times daily.          No Known Allergies  Past Medical History  Diagnosis Date  . Diabetes mellitus     nephropathy, neuropathy, retinopathy, PVD  . PVD (peripheral vascular disease)   . Hypertension   . GERD (gastroesophageal reflux disease)   . Bladder cancer   . Basal cell carcinoma     right temple  . Chronic venous insufficiency   . Renal insufficiency     Past Surgical History  Procedure Date  . Esophagogastroduodenoscopy 04/1999  . Shoulder arthroscopy w/ acromial repair 1950    left shoulder seperation repair  . Knee arthroscopy 12/2001    Dr.Miller  . Appendectomy   . Shoulder arthroscopy distal clavicle excision and open rotator cuff repair   . Carotid stent     left leg, Dr. Earnestine Leys  . Cholecystectomy 01/2010  . Toe amputation 03/2010    right great toe, Dr. Gilda Crease    Family History  Problem Relation Age of Onset  . Heart disease Father   . Cancer Neg Hx     colon cancer, no history    History   Social History  .  Marital Status: Widowed    Spouse Name: N/A    Number of Children: 2  . Years of Education: N/A   Occupational History  . retired    Social History Main Topics  . Smoking status: Former Games developer  . Smokeless tobacco: Never Used  . Alcohol Use: No  . Drug Use: Not on file  . Sexually Active: Not on file   Other Topics Concern  . Not on file   Social History Narrative   Daily caffeine use   Review of Systems Ear is better now Still has poor hearing Cutaneous horn has recurred--I treated and then Dr Roseanne Kaufman    Objective:   Physical Exam  Constitutional: He appears well-developed and well-nourished. No distress.  Neck: Normal range of motion. Neck supple.  Cardiovascular: Normal rate, regular rhythm and normal heart sounds.  Exam reveals no gallop.   No murmur heard.      Very slight distal pulses  Pulmonary/Chest: Effort normal and breath sounds normal. No respiratory distress. He has no  wheezes. He has no rales.  Musculoskeletal: He exhibits edema.       1+ ankle edema --- R>L  Lymphadenopathy:    He has no cervical adenopathy.  Psychiatric: He has a normal mood and affect. His behavior is normal. Judgment and thought content normal.          Assessment & Plan:

## 2011-07-05 NOTE — Assessment & Plan Note (Signed)
Lab Results  Component Value Date   HGBA1C 8.7* 01/01/2011   Control is suboptimal He forgets the evening dose often at times Will change to lantus so he only needs once a day (and less risk of hypoglycemia)

## 2011-07-05 NOTE — Assessment & Plan Note (Signed)
BP Readings from Last 3 Encounters:  07/05/11 128/67  05/21/11 140/84  04/08/11 160/70   BP is better on higher enalapril Never started hydralazine---told him not to at this point

## 2011-07-05 NOTE — Patient Instructions (Signed)
Please stop the novolog when it runs out. Then start the lantus daily and adjust depending on how your sugars are running

## 2011-07-05 NOTE — Assessment & Plan Note (Signed)
Had right great toe amputated No new lesions now Sees Dr Al Corpus regularly

## 2011-07-05 NOTE — Assessment & Plan Note (Signed)
Keeps up with Dr Cherylann Ratel Now on vitamin D also

## 2011-07-06 LAB — HEMOGLOBIN A1C: Hgb A1c MFr Bld: 8.6 % — ABNORMAL HIGH (ref 4.6–6.5)

## 2011-07-16 ENCOUNTER — Encounter: Payer: Self-pay | Admitting: *Deleted

## 2011-09-14 HISTORY — PX: TRANSURETHRAL RESECTION OF BLADDER TUMOR: SHX2575

## 2011-09-20 ENCOUNTER — Encounter: Payer: Self-pay | Admitting: Family Medicine

## 2011-09-20 ENCOUNTER — Ambulatory Visit (INDEPENDENT_AMBULATORY_CARE_PROVIDER_SITE_OTHER): Payer: Medicare Other | Admitting: Family Medicine

## 2011-09-20 DIAGNOSIS — H01009 Unspecified blepharitis unspecified eye, unspecified eyelid: Secondary | ICD-10-CM

## 2011-09-20 DIAGNOSIS — H01003 Unspecified blepharitis right eye, unspecified eyelid: Secondary | ICD-10-CM | POA: Insufficient documentation

## 2011-09-20 DIAGNOSIS — H0019 Chalazion unspecified eye, unspecified eyelid: Secondary | ICD-10-CM

## 2011-09-20 DIAGNOSIS — H0015 Chalazion left lower eyelid: Secondary | ICD-10-CM

## 2011-09-20 DIAGNOSIS — H01006 Unspecified blepharitis left eye, unspecified eyelid: Secondary | ICD-10-CM | POA: Insufficient documentation

## 2011-09-20 MED ORDER — ERYTHROMYCIN 5 MG/GM OP OINT: TOPICAL_OINTMENT | Freq: Two times a day (BID) | OPHTHALMIC | Status: AC

## 2011-09-20 NOTE — Progress Notes (Signed)
  Subjective:    Patient ID: [name] Rua., male    DOB: 05-21-1926, 78 y.o.   MRN: [id number]  HPI  78 year old male with history of HTN, PVD, DM presents with red irritated pot on left lower lid 09/14/2011. No blister, no discharge. No conjunctivitits.  Can feel area but not painful. No pain with EOM movement. No visual changes except occ eye cloud over.  No eye injury, no falls.  No fever.  Review of Systems  Constitutional: Negative for fever and fatigue.  HENT: Positive for neck pain. Negative for ear pain.   Eyes: Negative for redness and visual disturbance.  Respiratory: Negative for shortness of breath.   Cardiovascular: Negative for chest pain.       Objective:   Physical Exam  Constitutional: He appears well-developed and well-nourished.  HENT:  Head: Normocephalic and atraumatic.  Right Ear: External ear normal.  Left Ear: External ear normal.  Nose: Nose normal.  Mouth/Throat: Oropharynx is clear and moist. No oropharyngeal exudate.  Eyes: EOM are normal. Pupils are equal, round, and reactive to light. Right conjunctiva is injected. Left conjunctiva is injected.       [place] left eyelid bottom lid Dry flaky skin and minimal eyelash growth B lower and upper lids Dried yellowish discharge at base of B eyes.          Assessment & Plan:

## 2011-09-20 NOTE — Assessment & Plan Note (Signed)
Likely due to what appears to blepharitits in B lids... Treat with antibiotic ointment and warm compresses

## 2011-09-20 NOTE — Patient Instructions (Addendum)
Wash eyelid margins with warm soapy water daily to remove skin flakes.  Warm compresses left eye 4 times dail.  Apply topical antibiotics to  left eyelid margin twice daily.

## 2011-11-12 ENCOUNTER — Other Ambulatory Visit: Payer: Self-pay | Admitting: *Deleted

## 2011-11-12 ENCOUNTER — Telehealth: Payer: Self-pay | Admitting: *Deleted

## 2011-11-12 MED ORDER — GLIPIZIDE ER 5 MG PO TB24: 5.0000 mg | ORAL_TABLET | Freq: Every day | ORAL | Status: DC

## 2011-11-12 MED ORDER — INSULIN GLARGINE 100 UNIT/ML ~~LOC~~ SOLN: 50.0000 [IU] | Freq: Every day | SUBCUTANEOUS | Status: DC

## 2011-11-12 MED ORDER — INSULIN GLARGINE 100 UNIT/ML ~~LOC~~ SOLN: 40.0000 [IU] | Freq: Every day | SUBCUTANEOUS | Status: DC

## 2011-11-12 NOTE — Telephone Encounter (Signed)
Pharmacy called back stating that mth supply for lantus is 7 pens not 10, rx sent to pharmacy by e-script

## 2011-11-12 NOTE — Telephone Encounter (Signed)
Patient calling asking for more insulin (lantus) spoke with [facility], pt is using more than 40 units daily and per the instructions his insurance won't allow for a refill until 11/23/11, pt refilled lantus 11/01/11. Spoke with patient and he's adjusting his insulin daily, I asked what is BS is running and today before breakfast it was 200, in the evening it's running between 140-150, pt is trying to get BS down to 120, right now he's using 50 units daily and per pt he will have to increase more units. Please advise

## 2011-11-12 NOTE — Telephone Encounter (Signed)
Please change the instructions to 50-70 units daily as directed May be able to get him 3000units a month with that

## 2011-11-12 NOTE — Telephone Encounter (Signed)
Spoke with patient and advised results rx sent to pharmacy by e-script  

## 2011-12-08 ENCOUNTER — Encounter: Payer: Self-pay | Admitting: Family Medicine

## 2011-12-08 ENCOUNTER — Ambulatory Visit (INDEPENDENT_AMBULATORY_CARE_PROVIDER_SITE_OTHER): Payer: Medicare Other | Admitting: Family Medicine

## 2011-12-08 VITALS — BP 150/74 | HR 67 | Temp 98.0°F | Ht 70.0 in | Wt 233.8 lb

## 2011-12-08 DIAGNOSIS — M519 Unspecified thoracic, thoracolumbar and lumbosacral intervertebral disc disorder: Secondary | ICD-10-CM

## 2011-12-08 DIAGNOSIS — M4646 Discitis, unspecified, lumbar region: Secondary | ICD-10-CM

## 2011-12-08 DIAGNOSIS — J209 Acute bronchitis, unspecified: Secondary | ICD-10-CM

## 2011-12-08 HISTORY — DX: Discitis, unspecified, lumbar region: M46.46

## 2011-12-08 MED ORDER — AMOXICILLIN 875 MG PO TABS: 875.0000 mg | ORAL_TABLET | Freq: Two times a day (BID) | ORAL | Status: AC

## 2011-12-08 NOTE — Progress Notes (Signed)
  Patient Name: [name] [name]. Date of Birth: 10/18/1925 Age: 78 y.o. Medical Record Number: [id number] Gender: male Date of Encounter: 12/08/2011  History of Present Illness:  [name] Honeyman. is a 78 y.o. very pleasant male patient who presents with the following:  Very pleasant patient who I have known for more than 20 years. He presents a one-week worth of worsening cough, productive of sputum, he has had some nasal congestion and some occasional running of his nose as well.  He was hospitalized for multiple months at the end of 2010 2011 for lumbar discitis. This is resolved with prolonged courses of IV antibiotics.  He has no smoking history no history of asthma or emphysema.  Had some bone infection at the end of the 2010 - lumbar discitis Still able to do some driving.  Starting last Friday, nose is rnning, nose congested and plugged up, coughing some. Has had some cough.     Past Medical History, Surgical History, Social History, Family History, Problem List, Medications, and Allergies have been reviewed and updated if relevant.  Review of Systems: ROS: GEN: Acute illness details above GI: Tolerating PO intake GU: maintaining adequate hydration and urination Pulm: No SOB Interactive and getting along well at home.  Otherwise, ROS is as per the HPI.   Physical Examination: Filed Vitals:   12/08/11 1352  BP: 150/74  Pulse: 67  Temp: 98 F (36.7 C)  TempSrc: Oral  Height: 5\' 10"  (1.778 m)  Weight: 233 lb 12.8 oz (106.051 kg)  SpO2: 96%    Body mass index is 33.55 kg/(m^2).   GEN: A and O x 3. WDWN. NAD.    ENT: Nose clear, ext NML.  No LAD.  No JVD.  TM's clear. Oropharynx clear.  PULM: Normal WOB, no distress. Crackles, L > R lung bases CV: RRR, no M/G/R, No rubs, No JVD.   EXT: warm and well-perfused, No c/c/e. PSYCH: Pleasant and conversant.   Assessment and Plan:  1. Bronchitis, acute   2. Lumbar discitis    Bronchitis vs PNA, will  need to appropriately cover with ABX given age and clinical context.  Orders Today: No orders of the defined types were placed in this encounter.    Medications Today: Meds ordered this encounter  Medications  . amoxicillin (AMOXIL) 875 MG tablet    Sig: Take 1 tablet (875 mg total) by mouth 2 (two) times daily.    Dispense:  20 tablet    Refill:  0

## 2011-12-21 ENCOUNTER — Ambulatory Visit: Payer: Self-pay | Admitting: Urology

## 2011-12-21 LAB — CBC WITH DIFFERENTIAL/PLATELET
Basophil #: 0 10*3/uL (ref 0.0–0.1)
Basophil %: 0.5 %
Eosinophil #: 0.2 10*3/uL (ref 0.0–0.7)
HGB: 12.5 g/dL — ABNORMAL LOW (ref 13.0–18.0)
Lymphocyte #: 2.4 10*3/uL (ref 1.0–3.6)
Lymphocyte %: 29 %
MCV: 94 fL (ref 80–100)
RBC: 4 10*6/uL — ABNORMAL LOW (ref 4.40–5.90)
WBC: 8.3 10*3/uL (ref 3.8–10.6)

## 2011-12-21 LAB — BASIC METABOLIC PANEL
Anion Gap: 8 (ref 7–16)
Chloride: 106 mmol/L (ref 98–107)
Co2: 26 mmol/L (ref 21–32)
Creatinine: 1.39 mg/dL — ABNORMAL HIGH (ref 0.60–1.30)
EGFR (African American): >60
Osmolality: 286 (ref 275–301)
Potassium: 4.1 mmol/L (ref 3.5–5.1)
Sodium: 140 mmol/L (ref 136–145)

## 2011-12-24 ENCOUNTER — Ambulatory Visit (INDEPENDENT_AMBULATORY_CARE_PROVIDER_SITE_OTHER): Payer: Medicare Other | Admitting: Internal Medicine

## 2011-12-24 ENCOUNTER — Encounter: Payer: Self-pay | Admitting: Internal Medicine

## 2011-12-24 VITALS — BP 142/76 | HR 56 | Temp 98.6°F | Ht 70.0 in | Wt 235.0 lb

## 2011-12-24 DIAGNOSIS — C679 Malignant neoplasm of bladder, unspecified: Secondary | ICD-10-CM

## 2011-12-24 DIAGNOSIS — E119 Type 2 diabetes mellitus without complications: Secondary | ICD-10-CM

## 2011-12-24 DIAGNOSIS — R9431 Abnormal electrocardiogram [ECG] [EKG]: Secondary | ICD-10-CM | POA: Insufficient documentation

## 2011-12-24 DIAGNOSIS — I10 Essential (primary) hypertension: Secondary | ICD-10-CM

## 2011-12-24 NOTE — Progress Notes (Signed)
Addended by: Alvina Chou on: 12/24/2011 04:13 PM   Modules accepted: Orders

## 2011-12-24 NOTE — Progress Notes (Signed)
Subjective:    Patient ID: [name] [name]., male    DOB: 11-24-1925, 78 y.o.   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  Current Outpatient Prescriptions on File Prior to Visit  Medication Sig Dispense Refill  . amLODipine (NORVASC) 10 MG tablet Take 1 tablet (10 mg total) by mouth daily.  30 tablet  11  . aspirin 81 MG tablet Take 81 mg by mouth daily.        . enalapril (VASOTEC) 20 MG tablet Take 1 tablet (20 mg total) by mouth 2 (two) times daily.  60 tablet  0  . furosemide (LASIX) 40 MG tablet Take 1 tablet (40 mg total) by mouth daily as needed.  30 tablet  6  . glipiZIDE (GLUCOTROL XL) 5 MG 24 hr tablet Take 5 mg by mouth daily.      Marland Kitchen glucosamine-chondroitin 500-400 MG tablet Take 1 tablet by mouth daily.        . insulin glargine (LANTUS) 100 UNIT/ML injection Inject 50-70 Units into the skin daily.  7 pen  11  . Insulin Pen Needle (NOVOFINE) 30G X 8 MM MISC Inject 14 units as directed  100 each  11  . metoprolol (TOPROL-XL) 100 MG 24 hr tablet Take 1 tablet (100 mg total) by mouth daily.  30 tablet  11  . Multiple Vitamins-Minerals (ONE-A-DAY 50 PLUS PO) Take by mouth daily.        Marland Kitchen omeprazole (PRILOSEC) 20 MG capsule Take 1 capsule (20 mg total) by mouth daily.  30 capsule  10  . vitamin E 400 UNIT capsule Take 400 Units by mouth 2 (two) times daily.            No Known Allergies  Past Medical History  Diagnosis Date  . Diabetes mellitus     nephropathy, neuropathy, retinopathy, PVD  . PVD (peripheral vascular disease)   . Hypertension   . GERD (gastroesophageal reflux disease)   . Bladder cancer   . Basal cell carcinoma     right temple  . Chronic venous insufficiency   . Renal insufficiency   . Lumbar discitis 12/08/2011    Past Surgical History  Procedure Date  . Esophagogastroduodenoscopy 04/1999  . Shoulder arthroscopy w/ acromial repair 1950    left shoulder seperation repair  . Knee arthroscopy 12/2001    Dr.Miller  . Appendectomy   . Shoulder arthroscopy distal clavicle excision and open rotator cuff repair   . Carotid stent     left leg, Dr. Earnestine Leys  . Cholecystectomy 01/2010  . Toe amputation 03/2010    right great toe, Dr. Gilda Crease    Family History  Problem Relation Age of Onset  . Heart disease Father   . Cancer Neg Hx     colon cancer, no history    History   Social History  . Marital Status: Widowed    Spouse  Name: N/A    Number of Children: 2  . Years of Education: N/A   Occupational History  . retired    Social History Main Topics  . Smoking status: Former Games developer  . Smokeless tobacco: Never Used  . Alcohol Use: No  . Drug Use: Not on file  . Sexually Active: Not on file   Other Topics Concern  . Not on file   Social History Narrative   Daily caffeine use   Review of Systems Appetite is okay Weight is stable Sleep is fine till 3AM---then transfers to chair (he feels either hot or cold). Sleeps enough between the 2 sites     Objective:   Physical Exam  Constitutional: He appears well-developed and well-nourished. No distress.  Neck: Normal range of motion. Neck supple. No thyromegaly present.  Cardiovascular: Normal rate, regular rhythm and normal heart sounds.  Exam reveals no gallop.   No murmur heard.      Faint distal pulses  Pulmonary/Chest: Effort normal and breath sounds  normal. No respiratory distress. He has no wheezes. He has no rales.  Abdominal: Soft. There is no tenderness.  Musculoskeletal: He exhibits edema.       Right great toe amputation site clean and dry 1+ non pitting edema  Lymphadenopathy:    He has no cervical adenopathy.  Skin:       Mycotic toenails No foot ulcers  Psychiatric: He has a normal mood and affect. His behavior is normal.          Assessment & Plan:

## 2011-12-24 NOTE — Assessment & Plan Note (Signed)
Control seems to be better since change to lantus  Will check a1c

## 2011-12-24 NOTE — Assessment & Plan Note (Signed)
Has poor R wave progression across precordium No sig ST or T wave changes This suggests axis shift and not prior infarct

## 2011-12-24 NOTE — Assessment & Plan Note (Signed)
Due for TURBT Fairly low risk procedure Abnormal EKG but it, as well as H&P, do not suggest ischemia He is medically clear to proceed with procedure on 4/15  He has mild residual cough from recent cold but lungs are clear

## 2011-12-24 NOTE — Assessment & Plan Note (Signed)
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 True sounds heard at 142 but hollow soft sounds up to 160 or so Has follow up with nephrologist No changes in meds for now Lab Results  Component Value Date   CREATININE 1.4 01/01/2011   Was 1.39 with preops, so stable

## 2011-12-25 LAB — HEMOGLOBIN A1C
Hgb A1c MFr Bld: 6.8 % — ABNORMAL HIGH (ref <5.7)
Mean Plasma Glucose: 148 mg/dL — ABNORMAL HIGH (ref <117)

## 2011-12-27 ENCOUNTER — Ambulatory Visit: Payer: Self-pay | Admitting: Urology

## 2011-12-27 ENCOUNTER — Encounter: Payer: Self-pay | Admitting: *Deleted

## 2012-01-06 ENCOUNTER — Ambulatory Visit: Payer: Medicare Other | Admitting: Internal Medicine

## 2012-01-07 ENCOUNTER — Other Ambulatory Visit: Payer: Self-pay | Admitting: *Deleted

## 2012-01-07 MED ORDER — GLIPIZIDE ER 5 MG PO TB24: 5.0000 mg | ORAL_TABLET | Freq: Every day | ORAL | Status: AC

## 2012-01-19 ENCOUNTER — Other Ambulatory Visit: Payer: Self-pay | Admitting: *Deleted

## 2012-01-19 MED ORDER — METOPROLOL SUCCINATE ER 100 MG PO TB24: 100.0000 mg | ORAL_TABLET | Freq: Every day | ORAL | Status: AC

## 2012-01-26 ENCOUNTER — Telehealth: Payer: Self-pay | Admitting: *Deleted

## 2012-01-26 NOTE — Telephone Encounter (Signed)
Form on your desk asking for diabetes supplies, pt does use this company.

## 2012-01-26 NOTE — Telephone Encounter (Signed)
Form done and sent to be faxed 

## 2012-02-28 ENCOUNTER — Other Ambulatory Visit: Payer: Self-pay | Admitting: *Deleted

## 2012-02-28 MED ORDER — OMEPRAZOLE 20 MG PO CPDR: 20.0000 mg | DELAYED_RELEASE_CAPSULE | Freq: Every day | ORAL | Status: AC

## 2012-05-17 ENCOUNTER — Other Ambulatory Visit: Payer: Self-pay | Admitting: *Deleted

## 2012-05-17 MED ORDER — FUROSEMIDE 40 MG PO TABS: 40.0000 mg | ORAL_TABLET | Freq: Every day | ORAL | Status: AC | PRN

## 2012-05-22 ENCOUNTER — Other Ambulatory Visit: Payer: Self-pay | Admitting: *Deleted

## 2012-05-22 MED ORDER — AMLODIPINE BESYLATE 10 MG PO TABS: 10.0000 mg | ORAL_TABLET | Freq: Every day | ORAL | Status: AC

## 2012-06-29 ENCOUNTER — Encounter: Payer: Self-pay | Admitting: Internal Medicine

## 2012-06-29 ENCOUNTER — Ambulatory Visit (INDEPENDENT_AMBULATORY_CARE_PROVIDER_SITE_OTHER): Payer: Medicare Other | Admitting: Internal Medicine

## 2012-06-29 VITALS — BP 150/68 | HR 57 | Temp 98.5°F | Wt 237.0 lb

## 2012-06-29 DIAGNOSIS — E119 Type 2 diabetes mellitus without complications: Secondary | ICD-10-CM

## 2012-06-29 DIAGNOSIS — N259 Disorder resulting from impaired renal tubular function, unspecified: Secondary | ICD-10-CM

## 2012-06-29 DIAGNOSIS — Z23 Encounter for immunization: Secondary | ICD-10-CM

## 2012-06-29 DIAGNOSIS — I10 Essential (primary) hypertension: Secondary | ICD-10-CM

## 2012-06-29 DIAGNOSIS — I739 Peripheral vascular disease, unspecified: Secondary | ICD-10-CM

## 2012-06-29 NOTE — Assessment & Plan Note (Signed)
Has been better in general Will recheck labs

## 2012-06-29 NOTE — Progress Notes (Signed)
Subjective:    Patient ID: [name] [name]., male    DOB: 12/27/1925, 78 y.o.   MRN: [id number]  HPI Has done okay with the bladder tumor removal  Needs ongoing follow up Dr Leonette Monarch wants him on vitamin E for this  Had a couple of facial cancers removed since last visit  Doesn't check BP Usually mildly elevated at nephrologist Now on hydralazine No chest pain No SOB No dizziness or syncope Edema seems stable  Checks sugars bid Up some today 140-150 Usually under 140---has been good No hypoglycemic reactions recently  Current Outpatient Prescriptions on File Prior to Visit  Medication Sig Dispense Refill  . amLODipine (NORVASC) 10 MG tablet Take 1 tablet (10 mg total) by mouth daily.  30 tablet  11  . aspirin 81 MG tablet Take 81 mg by mouth daily.        . furosemide (LASIX) 40 MG tablet Take 1 tablet (40 mg total) by mouth daily as needed.  30 tablet  6  . glipiZIDE (GLUCOTROL XL) 5 MG 24 hr tablet Take 1 tablet (5 mg total) by mouth daily.  30 tablet  11  . glucosamine-chondroitin 500-400 MG tablet Take 1 tablet by mouth daily.        . hydrALAZINE (APRESOLINE) 25 MG tablet Take 25 mg by mouth 3 (three) times daily.       . insulin glargine (LANTUS) 100 UNIT/ML injection Inject 50-70 Units into the skin daily.  7 pen  11  . Insulin Pen Needle (NOVOFINE) 30G X 8 MM MISC Inject 14 units as directed  100 each  11  . metoprolol succinate (TOPROL-XL) 100 MG 24 hr tablet Take 1 tablet (100 mg total) by mouth daily.  30 tablet  11  . Multiple Vitamins-Minerals (ONE-A-DAY 50 PLUS PO) Take by mouth daily.        Marland Kitchen omeprazole (PRILOSEC) 20 MG capsule Take 1 capsule (20 mg total) by mouth daily.  30 capsule  11  . vitamin E 400 UNIT capsule Take 400 Units by mouth 2 (two) times daily.        Marland Kitchen DISCONTD: enalapril (VASOTEC) 20 MG tablet Take 1 tablet (20 mg total) by mouth 2 (two) times daily.  60 tablet  0    No Known Allergies  Past Medical History  Diagnosis Date  . Diabetes  mellitus     nephropathy, neuropathy, retinopathy, PVD  . PVD (peripheral vascular disease)   . Hypertension   . GERD (gastroesophageal reflux disease)   . Bladder cancer   . Basal cell carcinoma     right temple  . Chronic venous insufficiency   . Renal insufficiency   . Lumbar discitis 12/08/2011    Past Surgical History  Procedure Date  . Esophagogastroduodenoscopy 04/1999  . Shoulder arthroscopy w/ acromial repair 1950    left shoulder seperation repair  . Knee arthroscopy 12/2001    Dr.Miller  . Appendectomy   . Shoulder arthroscopy distal clavicle excision and open rotator cuff repair   . Carotid stent     left leg, Dr. Earnestine Leys  . Cholecystectomy 01/2010  . Toe amputation 03/2010    right great toe, Dr. Gilda Crease  . Transurethral resection of bladder tumor 2013    Family History  Problem Relation Age of Onset  . Heart disease Father   . Cancer Neg Hx     colon cancer, no history    History   Social History  . Marital Status: Widowed  Spouse Name: N/A    Number of Children: 2  . Years of Education: N/A   Occupational History  . retired    Social History Main Topics  . Smoking status: Former Games developer  . Smokeless tobacco: Never Used  . Alcohol Use: No  . Drug Use: Not on file  . Sexually Active: Not on file   Other Topics Concern  . Not on file   Social History Narrative   Daily caffeine use   Review of Systems Had some loose stools and incontinence today Thinks this may have added to increased BP Hasn't been sick. May occur once a month or so    Objective:   Physical Exam  Constitutional: He appears well-developed and well-nourished. No distress.  Neck: Normal range of motion. Neck supple.  Cardiovascular: Normal rate and regular rhythm.  Exam reveals no gallop.   Murmur heard.      Slight systolic murmur at base Pedal pulses not palpable  Pulmonary/Chest: Effort normal and breath sounds normal. No respiratory distress. He has no wheezes. He  has no rales.  Musculoskeletal: He exhibits edema.       1-2+ edema in feet and calves  Lymphadenopathy:    He has no cervical adenopathy.  Psychiatric: He has a normal mood and affect. His behavior is normal.          Assessment & Plan:

## 2012-06-29 NOTE — Assessment & Plan Note (Signed)
BP Readings from Last 3 Encounters:  06/29/12 150/68  12/24/11 142/76  12/08/11 150/74   Mild elevations but probably okay for him Will be following with Dr Cherylann Ratel

## 2012-06-29 NOTE — Addendum Note (Signed)
Addended by: Sueanne Margarita on: 06/29/2012 04:27 PM   Modules accepted: Orders

## 2012-06-29 NOTE — Assessment & Plan Note (Signed)
Feet look good No foot lesions now

## 2012-06-29 NOTE — Assessment & Plan Note (Signed)
Will recheck labs and forward to Dr Cherylann Ratel

## 2012-06-30 LAB — LDL CHOLESTEROL, DIRECT: Direct LDL: 115.8 mg/dL

## 2012-06-30 LAB — BASIC METABOLIC PANEL
BUN: 26 mg/dL — ABNORMAL HIGH (ref 6–23)
CO2: 25 mEq/L (ref 19–32)
Chloride: 110 mEq/L (ref 96–112)
Glucose, Bld: 115 mg/dL — ABNORMAL HIGH (ref 70–99)
Potassium: 5.2 mEq/L — ABNORMAL HIGH (ref 3.5–5.1)

## 2012-06-30 LAB — HEMOGLOBIN A1C: Hgb A1c MFr Bld: 6.5 % (ref 4.6–6.5)

## 2012-06-30 LAB — CBC WITH DIFFERENTIAL/PLATELET
Eosinophils Absolute: 0.3 10*3/uL (ref 0.0–0.7)
Eosinophils Relative: 2.8 % (ref 0.0–5.0)
HCT: 37.4 % — ABNORMAL LOW (ref 39.0–52.0)
Lymphs Abs: 2.2 10*3/uL (ref 0.7–4.0)
MCHC: 32.5 g/dL (ref 30.0–36.0)
MCV: 94.6 fl (ref 78.0–100.0)
Monocytes Absolute: 0.5 10*3/uL (ref 0.1–1.0)
Platelets: 216 10*3/uL (ref 150.0–400.0)
RDW: 15.2 % — ABNORMAL HIGH (ref 11.5–14.6)
WBC: 9.4 10*3/uL (ref 4.5–10.5)

## 2012-06-30 LAB — HEPATIC FUNCTION PANEL
AST: 13 U/L (ref 0–37)
Albumin: 3.2 g/dL — ABNORMAL LOW (ref 3.5–5.2)

## 2012-06-30 LAB — TSH: TSH: 2.32 u[IU]/mL (ref 0.35–5.50)

## 2012-06-30 LAB — LIPID PANEL: Cholesterol: 206 mg/dL — ABNORMAL HIGH (ref 0–200)

## 2012-11-17 ENCOUNTER — Inpatient Hospital Stay: Payer: Self-pay | Admitting: Orthopedic Surgery

## 2012-11-17 LAB — COMPREHENSIVE METABOLIC PANEL
Albumin: 3 g/dL — ABNORMAL LOW (ref 3.4–5.0)
Chloride: 113 mmol/L — ABNORMAL HIGH (ref 98–107)
Creatinine: 1.71 mg/dL — ABNORMAL HIGH (ref 0.60–1.30)
EGFR (African American): 42 — ABNORMAL LOW
EGFR (Non-African Amer.): 36 — ABNORMAL LOW
Glucose: 117 mg/dL — ABNORMAL HIGH (ref 65–99)
Osmolality: 290 (ref 275–301)
Potassium: 4 mmol/L (ref 3.5–5.1)
SGOT(AST): 21 U/L (ref 15–37)
SGPT (ALT): 22 U/L (ref 12–78)
Sodium: 143 mmol/L (ref 136–145)
Total Protein: 7.2 g/dL (ref 6.4–8.2)

## 2012-11-17 LAB — URINALYSIS, COMPLETE
Bacteria: NONE SEEN
Cellular Cast: 3
Glucose,UR: 50 mg/dL (ref 0–75)
Ketone: NEGATIVE
Nitrite: NEGATIVE
Ph: 5 (ref 4.5–8.0)
Protein: >=500
RBC,UR: 1 /HPF (ref 0–5)
WBC UR: 1 /HPF (ref 0–5)

## 2012-11-17 LAB — PROTIME-INR
INR: 0.9
Prothrombin Time: 12.8 secs (ref 11.5–14.7)

## 2012-11-17 LAB — CBC WITH DIFFERENTIAL/PLATELET
Basophil #: 0.1 10*3/uL (ref 0.0–0.1)
Basophil %: 0.3 %
Eosinophil %: 0.3 %
HCT: 37.5 % — ABNORMAL LOW (ref 40.0–52.0)
Lymphocyte %: 8.5 %
MCHC: 32.7 g/dL (ref 32.0–36.0)
MCV: 93 fL (ref 80–100)
Monocyte %: 3.6 %
Neutrophil #: 16.5 10*3/uL — ABNORMAL HIGH (ref 1.4–6.5)
Neutrophil %: 87.3 %
Platelet: 207 10*3/uL (ref 150–440)
RBC: 4.02 10*6/uL — ABNORMAL LOW (ref 4.40–5.90)
WBC: 18.9 10*3/uL — ABNORMAL HIGH (ref 3.8–10.6)

## 2012-11-17 LAB — APTT: Activated PTT: 35.4 secs (ref 23.6–35.9)

## 2012-11-18 LAB — BASIC METABOLIC PANEL
BUN: 24 mg/dL — ABNORMAL HIGH (ref 7–18)
Calcium, Total: 8.1 mg/dL — ABNORMAL LOW (ref 8.5–10.1)
Chloride: 111 mmol/L — ABNORMAL HIGH (ref 98–107)
Co2: 27 mmol/L (ref 21–32)
Creatinine: 2 mg/dL — ABNORMAL HIGH (ref 0.60–1.30)
EGFR (Non-African Amer.): 30 — ABNORMAL LOW
Glucose: 51 mg/dL — ABNORMAL LOW (ref 65–99)
Osmolality: 285 (ref 275–301)
Potassium: 4.1 mmol/L (ref 3.5–5.1)
Sodium: 142 mmol/L (ref 136–145)

## 2012-11-18 LAB — CBC WITH DIFFERENTIAL/PLATELET
Lymphocyte #: 1.4 10*3/uL (ref 1.0–3.6)
Lymphocyte %: 11.4 %
MCV: 94 fL (ref 80–100)
Monocyte #: 0.7 x10 3/mm (ref 0.2–1.0)
Monocyte %: 5.8 %
Neutrophil #: 10.2 10*3/uL — ABNORMAL HIGH (ref 1.4–6.5)
Neutrophil %: 81.2 %
Platelet: 175 10*3/uL (ref 150–440)
RBC: 3.38 10*6/uL — ABNORMAL LOW (ref 4.40–5.90)
RDW: 15.4 % — ABNORMAL HIGH (ref 11.5–14.5)
WBC: 12.6 10*3/uL — ABNORMAL HIGH (ref 3.8–10.6)

## 2012-11-19 LAB — CBC WITH DIFFERENTIAL/PLATELET
Basophil #: 0 10*3/uL (ref 0.0–0.1)
Eosinophil #: 0.2 10*3/uL (ref 0.0–0.7)
Eosinophil %: 1.6 %
HCT: 28.5 % — ABNORMAL LOW (ref 40.0–52.0)
HGB: 9.5 g/dL — ABNORMAL LOW (ref 13.0–18.0)
Lymphocyte #: 1.3 10*3/uL (ref 1.0–3.6)
Lymphocyte %: 13 %
MCH: 31.2 pg (ref 26.0–34.0)
MCHC: 33.4 g/dL (ref 32.0–36.0)
MCV: 93 fL (ref 80–100)
Monocyte #: 0.9 x10 3/mm (ref 0.2–1.0)
Monocyte %: 8.3 %
Neutrophil #: 7.8 10*3/uL — ABNORMAL HIGH (ref 1.4–6.5)
Neutrophil %: 76.7 %
Platelet: 129 10*3/uL — ABNORMAL LOW (ref 150–440)
RDW: 15.1 % — ABNORMAL HIGH (ref 11.5–14.5)

## 2012-11-19 LAB — BASIC METABOLIC PANEL
Anion Gap: 9 (ref 7–16)
BUN: 23 mg/dL — ABNORMAL HIGH (ref 7–18)
Calcium, Total: 8 mg/dL — ABNORMAL LOW (ref 8.5–10.1)
Potassium: 4 mmol/L (ref 3.5–5.1)
Sodium: 139 mmol/L (ref 136–145)

## 2012-11-20 LAB — BASIC METABOLIC PANEL
BUN: 26 mg/dL — ABNORMAL HIGH (ref 7–18)
Chloride: 108 mmol/L — ABNORMAL HIGH (ref 98–107)
Co2: 26 mmol/L (ref 21–32)
EGFR (Non-African Amer.): 30 — ABNORMAL LOW
Osmolality: 285 (ref 275–301)

## 2012-11-20 LAB — URINE CULTURE

## 2012-11-22 LAB — BASIC METABOLIC PANEL
BUN: 33 mg/dL — ABNORMAL HIGH (ref 7–18)
Co2: 23 mmol/L (ref 21–32)
Creatinine: 1.83 mg/dL — ABNORMAL HIGH (ref 0.60–1.30)
Glucose: 158 mg/dL — ABNORMAL HIGH (ref 65–99)
Potassium: 3.7 mmol/L (ref 3.5–5.1)

## 2012-11-23 LAB — CULTURE, BLOOD (SINGLE)

## 2012-11-24 LAB — PATHOLOGY REPORT

## 2012-12-01 ENCOUNTER — Emergency Department: Payer: Self-pay | Admitting: Emergency Medicine

## 2012-12-01 LAB — CBC WITH DIFFERENTIAL/PLATELET
Basophil #: 0 10*3/uL (ref 0.0–0.1)
Basophil %: 0.1 %
Eosinophil #: 0.5 10*3/uL (ref 0.0–0.7)
Eosinophil %: 4.3 %
HGB: 9.3 g/dL — ABNORMAL LOW (ref 13.0–18.0)
Lymphocyte #: 0.8 10*3/uL — ABNORMAL LOW (ref 1.0–3.6)
Lymphocyte %: 6.5 %
MCH: 31 pg (ref 26.0–34.0)
MCV: 93 fL (ref 80–100)
Monocyte #: 0.8 x10 3/mm (ref 0.2–1.0)
Monocyte %: 6.3 %

## 2012-12-01 LAB — URINALYSIS, COMPLETE
Bilirubin,UR: NEGATIVE
Blood: NEGATIVE
Ketone: NEGATIVE
Leukocyte Esterase: NEGATIVE
Ph: 5 (ref 4.5–8.0)
Protein: 100
Squamous Epithelial: <1

## 2012-12-01 LAB — COMPREHENSIVE METABOLIC PANEL
Albumin: 2.4 g/dL — ABNORMAL LOW (ref 3.4–5.0)
Anion Gap: 6 — ABNORMAL LOW (ref 7–16)
Calcium, Total: 8.1 mg/dL — ABNORMAL LOW (ref 8.5–10.1)
Chloride: 112 mmol/L — ABNORMAL HIGH (ref 98–107)
Co2: 24 mmol/L (ref 21–32)
EGFR (African American): 41 — ABNORMAL LOW
Glucose: 130 mg/dL — ABNORMAL HIGH (ref 65–99)
SGPT (ALT): 19 U/L (ref 12–78)

## 2012-12-01 LAB — LIPASE, BLOOD: Lipase: 127 U/L (ref 73–393)

## 2012-12-04 ENCOUNTER — Inpatient Hospital Stay: Payer: Self-pay | Admitting: Internal Medicine

## 2012-12-04 LAB — URINALYSIS, COMPLETE
Blood: NEGATIVE
Glucose,UR: NEGATIVE mg/dL (ref 0–75)
Hyaline Cast: 1
Ketone: NEGATIVE
Leukocyte Esterase: NEGATIVE
Nitrite: NEGATIVE
Ph: 5 (ref 4.5–8.0)
Protein: 100
RBC,UR: 1 /HPF (ref 0–5)
Squamous Epithelial: 1
WBC UR: 5 /HPF (ref 0–5)

## 2012-12-04 LAB — COMPREHENSIVE METABOLIC PANEL
Alkaline Phosphatase: 74 U/L (ref 50–136)
Anion Gap: 6 — ABNORMAL LOW (ref 7–16)
BUN: 34 mg/dL — ABNORMAL HIGH (ref 7–18)
Bilirubin,Total: 0.2 mg/dL (ref 0.2–1.0)
Chloride: 113 mmol/L — ABNORMAL HIGH (ref 98–107)
Co2: 23 mmol/L (ref 21–32)
Creatinine: 1.86 mg/dL — ABNORMAL HIGH (ref 0.60–1.30)
EGFR (African American): 37 — ABNORMAL LOW
Potassium: 4.4 mmol/L (ref 3.5–5.1)
SGOT(AST): 17 U/L (ref 15–37)
SGPT (ALT): 17 U/L (ref 12–78)
Sodium: 142 mmol/L (ref 136–145)

## 2012-12-04 LAB — MAGNESIUM: Magnesium: 1.9 mg/dL

## 2012-12-04 LAB — CBC
HGB: 9 g/dL — ABNORMAL LOW (ref 13.0–18.0)
MCV: 94 fL (ref 80–100)
Platelet: 396 10*3/uL (ref 150–440)
RBC: 2.9 10*6/uL — ABNORMAL LOW (ref 4.40–5.90)
RDW: 14.6 % — ABNORMAL HIGH (ref 11.5–14.5)

## 2012-12-04 LAB — PRO B NATRIURETIC PEPTIDE: B-Type Natriuretic Peptide: 7296 pg/mL — ABNORMAL HIGH (ref 0–450)

## 2012-12-04 LAB — TROPONIN I: Troponin-I: <0.02 ng/mL

## 2012-12-05 LAB — CBC WITH DIFFERENTIAL/PLATELET
Basophil #: 0.1 10*3/uL (ref 0.0–0.1)
Basophil %: 0.7 %
Eosinophil %: 6.5 %
HCT: 24.9 % — ABNORMAL LOW (ref 40.0–52.0)
HGB: 8.4 g/dL — ABNORMAL LOW (ref 13.0–18.0)
Monocyte %: 9.8 %
Neutrophil #: 4.4 10*3/uL (ref 1.4–6.5)
Neutrophil %: 58.1 %
Platelet: 313 10*3/uL (ref 150–440)
RBC: 2.66 10*6/uL — ABNORMAL LOW (ref 4.40–5.90)
RDW: 14.7 % — ABNORMAL HIGH (ref 11.5–14.5)

## 2012-12-05 LAB — BASIC METABOLIC PANEL
Anion Gap: 6 — ABNORMAL LOW (ref 7–16)
BUN: 30 mg/dL — ABNORMAL HIGH (ref 7–18)
Co2: 24 mmol/L (ref 21–32)
Creatinine: 1.96 mg/dL — ABNORMAL HIGH (ref 0.60–1.30)
Glucose: 95 mg/dL (ref 65–99)
Osmolality: 285 (ref 275–301)
Sodium: 140 mmol/L (ref 136–145)

## 2012-12-05 LAB — MAGNESIUM: Magnesium: 1.9 mg/dL

## 2012-12-25 ENCOUNTER — Inpatient Hospital Stay: Payer: Self-pay | Admitting: Internal Medicine

## 2012-12-25 DIAGNOSIS — I517 Cardiomegaly: Secondary | ICD-10-CM

## 2012-12-25 LAB — CLOSTRIDIUM DIFFICILE BY PCR

## 2012-12-25 LAB — COMPREHENSIVE METABOLIC PANEL
Albumin: 2.6 g/dL — ABNORMAL LOW (ref 3.4–5.0)
Alkaline Phosphatase: 57 U/L (ref 50–136)
Bilirubin,Total: 0.2 mg/dL (ref 0.2–1.0)
EGFR (African American): 12 — ABNORMAL LOW
Potassium: 6.9 mmol/L (ref 3.5–5.1)
SGPT (ALT): 16 U/L (ref 12–78)
Sodium: 137 mmol/L (ref 136–145)
Total Protein: 6.7 g/dL (ref 6.4–8.2)

## 2012-12-25 LAB — CK TOTAL AND CKMB (NOT AT ARMC)
CK, Total: 20 U/L — ABNORMAL LOW (ref 35–232)
CK, Total: 20 U/L — ABNORMAL LOW (ref 35–232)
CK-MB: 2.2 ng/mL (ref 0.5–3.6)
CK-MB: 2.4 ng/mL (ref 0.5–3.6)

## 2012-12-25 LAB — BASIC METABOLIC PANEL
BUN: 82 mg/dL — ABNORMAL HIGH (ref 7–18)
Chloride: 107 mmol/L (ref 98–107)
Creatinine: 4.9 mg/dL — ABNORMAL HIGH (ref 0.60–1.30)
EGFR (African American): 12 — ABNORMAL LOW
EGFR (Non-African Amer.): 10 — ABNORMAL LOW
Osmolality: 304 (ref 275–301)
Sodium: 139 mmol/L (ref 136–145)

## 2012-12-25 LAB — TROPONIN I
Troponin-I: 0.66 ng/mL — ABNORMAL HIGH
Troponin-I: 0.78 ng/mL — ABNORMAL HIGH

## 2012-12-25 LAB — CBC WITH DIFFERENTIAL/PLATELET
Eosinophil #: 0.1 10*3/uL (ref 0.0–0.7)
Eosinophil %: 1 %
HGB: 9.2 g/dL — ABNORMAL LOW (ref 13.0–18.0)
MCH: 30 pg (ref 26.0–34.0)
MCHC: 31.4 g/dL — ABNORMAL LOW (ref 32.0–36.0)
MCV: 96 fL (ref 80–100)
Monocyte #: 0.7 x10 3/mm (ref 0.2–1.0)
Monocyte %: 8.5 %
Neutrophil #: 6.6 10*3/uL — ABNORMAL HIGH (ref 1.4–6.5)
Neutrophil %: 83.6 %
RDW: 16.3 % — ABNORMAL HIGH (ref 11.5–14.5)

## 2012-12-25 LAB — PRO B NATRIURETIC PEPTIDE: B-Type Natriuretic Peptide: 28340 pg/mL — ABNORMAL HIGH (ref 0–450)

## 2012-12-26 LAB — CBC WITH DIFFERENTIAL/PLATELET
Basophil #: 0.1 10*3/uL (ref 0.0–0.1)
Basophil %: 1.3 %
Eosinophil %: 2.7 %
HCT: 27.5 % — ABNORMAL LOW (ref 40.0–52.0)
Lymphocyte #: 1 10*3/uL (ref 1.0–3.6)
Lymphocyte %: 13.4 %
MCHC: 31.4 g/dL — ABNORMAL LOW (ref 32.0–36.0)
MCV: 96 fL (ref 80–100)
Monocyte %: 9.7 %
RBC: 2.88 10*6/uL — ABNORMAL LOW (ref 4.40–5.90)
WBC: 7.7 10*3/uL (ref 3.8–10.6)

## 2012-12-26 LAB — BASIC METABOLIC PANEL
Co2: 23 mmol/L (ref 21–32)
EGFR (African American): 11 — ABNORMAL LOW
Osmolality: 303 (ref 275–301)
Potassium: 6 mmol/L — ABNORMAL HIGH (ref 3.5–5.1)
Sodium: 138 mmol/L (ref 136–145)

## 2012-12-26 LAB — POTASSIUM: Potassium: 5.8 mmol/L — ABNORMAL HIGH (ref 3.5–5.1)

## 2012-12-26 LAB — CK TOTAL AND CKMB (NOT AT ARMC): CK-MB: 2.2 ng/mL (ref 0.5–3.6)

## 2012-12-27 LAB — BASIC METABOLIC PANEL
Anion Gap: 9 (ref 7–16)
Calcium, Total: 8.2 mg/dL — ABNORMAL LOW (ref 8.5–10.1)
Chloride: 109 mmol/L — ABNORMAL HIGH (ref 98–107)
Co2: 22 mmol/L (ref 21–32)
EGFR (Non-African Amer.): 9 — ABNORMAL LOW
Osmolality: 306 (ref 275–301)
Sodium: 140 mmol/L (ref 136–145)

## 2012-12-27 LAB — WBC: WBC: 6.7 10*3/uL (ref 3.8–10.6)

## 2012-12-28 LAB — BASIC METABOLIC PANEL
Anion Gap: 7 (ref 7–16)
BUN: 81 mg/dL — ABNORMAL HIGH (ref 7–18)
Co2: 23 mmol/L (ref 21–32)
EGFR (African American): 11 — ABNORMAL LOW
Glucose: 94 mg/dL (ref 65–99)
Osmolality: 309 (ref 275–301)
Sodium: 143 mmol/L (ref 136–145)

## 2012-12-29 LAB — CBC WITH DIFFERENTIAL/PLATELET
Basophil #: 0 10*3/uL (ref 0.0–0.1)
Basophil %: 0.1 %
Eosinophil %: 2.3 %
Lymphocyte #: 0.7 10*3/uL — ABNORMAL LOW (ref 1.0–3.6)
MCV: 96 fL (ref 80–100)
Monocyte %: 8.3 %
Neutrophil %: 77.3 %
RBC: 2.76 10*6/uL — ABNORMAL LOW (ref 4.40–5.90)
WBC: 5.5 10*3/uL (ref 3.8–10.6)

## 2012-12-29 LAB — BASIC METABOLIC PANEL
Anion Gap: 9 (ref 7–16)
BUN: 73 mg/dL — ABNORMAL HIGH (ref 7–18)
Calcium, Total: 8.3 mg/dL — ABNORMAL LOW (ref 8.5–10.1)
Creatinine: 4.8 mg/dL — ABNORMAL HIGH (ref 0.60–1.30)
Sodium: 143 mmol/L (ref 136–145)

## 2012-12-29 LAB — PHOSPHORUS: Phosphorus: 5.7 mg/dL — ABNORMAL HIGH (ref 2.5–4.9)

## 2012-12-30 DIAGNOSIS — I499 Cardiac arrhythmia, unspecified: Secondary | ICD-10-CM

## 2012-12-30 LAB — BASIC METABOLIC PANEL
Anion Gap: 5 — ABNORMAL LOW (ref 7–16)
Chloride: 108 mmol/L — ABNORMAL HIGH (ref 98–107)
Co2: 28 mmol/L (ref 21–32)
Creatinine: 3.95 mg/dL — ABNORMAL HIGH (ref 0.60–1.30)
EGFR (Non-African Amer.): 13 — ABNORMAL LOW
Glucose: 149 mg/dL — ABNORMAL HIGH (ref 65–99)
Potassium: 4.6 mmol/L (ref 3.5–5.1)
Sodium: 141 mmol/L (ref 136–145)

## 2012-12-30 LAB — PHOSPHORUS: Phosphorus: 5.5 mg/dL — ABNORMAL HIGH (ref 2.5–4.9)

## 2012-12-30 LAB — PLATELET COUNT: Platelet: 186 10*3/uL (ref 150–440)

## 2012-12-30 LAB — CULTURE, BLOOD (SINGLE)

## 2012-12-31 LAB — BASIC METABOLIC PANEL
BUN: 44 mg/dL — ABNORMAL HIGH (ref 7–18)
Calcium, Total: 8.2 mg/dL — ABNORMAL LOW (ref 8.5–10.1)
Chloride: 107 mmol/L (ref 98–107)
EGFR (African American): 22 — ABNORMAL LOW
EGFR (Non-African Amer.): 19 — ABNORMAL LOW
Glucose: 66 mg/dL (ref 65–99)
Osmolality: 293 (ref 275–301)

## 2013-01-01 LAB — BASIC METABOLIC PANEL
BUN: 47 mg/dL — ABNORMAL HIGH (ref 7–18)
Co2: 30 mmol/L (ref 21–32)
EGFR (African American): 19 — ABNORMAL LOW
EGFR (Non-African Amer.): 17 — ABNORMAL LOW
Glucose: 99 mg/dL (ref 65–99)
Osmolality: 294 (ref 275–301)
Potassium: 4.6 mmol/L (ref 3.5–5.1)
Sodium: 141 mmol/L (ref 136–145)

## 2013-01-01 LAB — CBC WITH DIFFERENTIAL/PLATELET
Basophil %: 0.2 %
HCT: 25.4 % — ABNORMAL LOW (ref 40.0–52.0)
HGB: 8.2 g/dL — ABNORMAL LOW (ref 13.0–18.0)
Lymphocyte #: 0.9 10*3/uL — ABNORMAL LOW (ref 1.0–3.6)
Lymphocyte %: 15.5 %
Monocyte #: 0.6 x10 3/mm (ref 0.2–1.0)
Monocyte %: 10.6 %
Neutrophil #: 4.2 10*3/uL (ref 1.4–6.5)
Platelet: 160 10*3/uL (ref 150–440)
RDW: 16.1 % — ABNORMAL HIGH (ref 11.5–14.5)
WBC: 5.9 10*3/uL (ref 3.8–10.6)

## 2013-01-01 LAB — URINALYSIS, COMPLETE
Bacteria: NONE SEEN
Bilirubin,UR: NEGATIVE
Ketone: NEGATIVE
Leukocyte Esterase: NEGATIVE
Nitrite: NEGATIVE
Ph: 5 (ref 4.5–8.0)
WBC UR: <1 /HPF (ref 0–5)

## 2013-01-01 LAB — PROTEIN / CREATININE RATIO, URINE: Protein/Creat. Ratio: 1317 mg/gCREAT — ABNORMAL HIGH (ref 0–200)

## 2013-01-02 DIAGNOSIS — I441 Atrioventricular block, second degree: Secondary | ICD-10-CM

## 2013-01-02 DIAGNOSIS — I472 Ventricular tachycardia: Secondary | ICD-10-CM

## 2013-01-02 LAB — BASIC METABOLIC PANEL
BUN: 49 mg/dL — ABNORMAL HIGH (ref 7–18)
Chloride: 106 mmol/L (ref 98–107)
Co2: 30 mmol/L (ref 21–32)
Creatinine: 3.5 mg/dL — ABNORMAL HIGH (ref 0.60–1.30)
EGFR (Non-African Amer.): 15 — ABNORMAL LOW
Osmolality: 292 (ref 275–301)
Potassium: 4.9 mmol/L (ref 3.5–5.1)
Sodium: 140 mmol/L (ref 136–145)

## 2013-01-03 ENCOUNTER — Ambulatory Visit: Payer: Self-pay | Admitting: Internal Medicine

## 2013-01-03 LAB — RENAL FUNCTION PANEL
Albumin: 2.4 g/dL — ABNORMAL LOW (ref 3.4–5.0)
Anion Gap: 3 — ABNORMAL LOW (ref 7–16)
Calcium, Total: 8.7 mg/dL (ref 8.5–10.1)
Co2: 31 mmol/L (ref 21–32)
Creatinine: 3.88 mg/dL — ABNORMAL HIGH (ref 0.60–1.30)
Glucose: 126 mg/dL — ABNORMAL HIGH (ref 65–99)
Osmolality: 295 (ref 275–301)
Phosphorus: 6.3 mg/dL — ABNORMAL HIGH (ref 2.5–4.9)
Potassium: 5.1 mmol/L (ref 3.5–5.1)
Sodium: 139 mmol/L (ref 136–145)

## 2013-01-04 LAB — CBC WITH DIFFERENTIAL/PLATELET
Basophil #: 0 10*3/uL (ref 0.0–0.1)
Basophil %: 0.3 %
HGB: 7.7 g/dL — ABNORMAL LOW (ref 13.0–18.0)
Lymphocyte #: 0.9 10*3/uL — ABNORMAL LOW (ref 1.0–3.6)
Lymphocyte %: 11.5 %
MCH: 30.5 pg (ref 26.0–34.0)
Monocyte #: 0.8 x10 3/mm (ref 0.2–1.0)
Neutrophil %: 78.2 %
WBC: 8.2 10*3/uL (ref 3.8–10.6)

## 2013-01-04 LAB — BASIC METABOLIC PANEL
Anion Gap: 3 — ABNORMAL LOW (ref 7–16)
Chloride: 103 mmol/L (ref 98–107)
Co2: 33 mmol/L — ABNORMAL HIGH (ref 21–32)
Creatinine: 3.27 mg/dL — ABNORMAL HIGH (ref 0.60–1.30)
EGFR (African American): 19 — ABNORMAL LOW
Osmolality: 290 (ref 275–301)
Potassium: 4.8 mmol/L (ref 3.5–5.1)

## 2013-01-06 LAB — BASIC METABOLIC PANEL
Anion Gap: 1 — ABNORMAL LOW (ref 7–16)
BUN: 40 mg/dL — ABNORMAL HIGH (ref 7–18)
Chloride: 101 mmol/L (ref 98–107)
EGFR (African American): 20 — ABNORMAL LOW
EGFR (Non-African Amer.): 17 — ABNORMAL LOW
Glucose: 104 mg/dL — ABNORMAL HIGH (ref 65–99)
Osmolality: 286 (ref 275–301)
Potassium: 4.8 mmol/L (ref 3.5–5.1)
Sodium: 138 mmol/L (ref 136–145)

## 2013-01-06 LAB — HEMOGLOBIN: HGB: 7.8 g/dL — ABNORMAL LOW (ref 13.0–18.0)

## 2013-01-07 LAB — BASIC METABOLIC PANEL
BUN: 35 mg/dL — ABNORMAL HIGH (ref 7–18)
Co2: 35 mmol/L — ABNORMAL HIGH (ref 21–32)
Creatinine: 2.87 mg/dL — ABNORMAL HIGH (ref 0.60–1.30)
Osmolality: 290 (ref 275–301)
Sodium: 141 mmol/L (ref 136–145)

## 2013-01-08 LAB — BASIC METABOLIC PANEL WITH GFR
Anion Gap: 4 — ABNORMAL LOW
BUN: 32 mg/dL — ABNORMAL HIGH
Calcium, Total: 8.5 mg/dL
Chloride: 102 mmol/L
Co2: 34 mmol/L — ABNORMAL HIGH
Creatinine: 2.73 mg/dL — ABNORMAL HIGH
EGFR (African American): 24 — ABNORMAL LOW
EGFR (Non-African Amer.): 20 — ABNORMAL LOW
Glucose: 79 mg/dL
Osmolality: 285
Potassium: 4.3 mmol/L
Sodium: 140 mmol/L

## 2013-01-08 LAB — PLATELET COUNT: Platelet: 147 x10 3/mm 3 — ABNORMAL LOW

## 2013-01-09 ENCOUNTER — Inpatient Hospital Stay
Admission: AD | Admit: 2013-01-09 | Discharge: 2013-02-11 | Disposition: E | Payer: Self-pay | Source: Ambulatory Visit | Attending: Internal Medicine | Admitting: Internal Medicine

## 2013-01-09 ENCOUNTER — Ambulatory Visit (HOSPITAL_COMMUNITY)
Admission: AD | Admit: 2013-01-09 | Discharge: 2013-01-09 | Disposition: A | Discharge status: Home / Self Care | Payer: Medicare Other | Source: Other Acute Inpatient Hospital | Attending: Internal Medicine | Admitting: Internal Medicine

## 2013-01-09 ENCOUNTER — Other Ambulatory Visit (HOSPITAL_COMMUNITY): Payer: Self-pay

## 2013-01-09 DIAGNOSIS — R0609 Other forms of dyspnea: Secondary | ICD-10-CM | POA: Insufficient documentation

## 2013-01-09 DIAGNOSIS — N19 Unspecified kidney failure: Secondary | ICD-10-CM | POA: Insufficient documentation

## 2013-01-09 DIAGNOSIS — R0989 Other specified symptoms and signs involving the circulatory and respiratory systems: Secondary | ICD-10-CM | POA: Insufficient documentation

## 2013-01-10 ENCOUNTER — Other Ambulatory Visit (HOSPITAL_COMMUNITY): Payer: Self-pay

## 2013-01-10 LAB — CBC
HCT: 24 % — ABNORMAL LOW (ref 39.0–52.0)
Hemoglobin: 7.4 g/dL — ABNORMAL LOW (ref 13.0–17.0)
MCH: 29.1 pg (ref 26.0–34.0)
MCHC: 30.8 g/dL (ref 30.0–36.0)
RDW: 15.5 % (ref 11.5–15.5)

## 2013-01-10 LAB — COMPREHENSIVE METABOLIC PANEL
Albumin: 2.1 g/dL — ABNORMAL LOW (ref 3.5–5.2)
BUN: 28 mg/dL — ABNORMAL HIGH (ref 6–23)
Calcium: 8.9 mg/dL (ref 8.4–10.5)
GFR calc Af Amer: 21 mL/min — ABNORMAL LOW (ref >90)
Glucose, Bld: 87 mg/dL (ref 70–99)
Potassium: 4.2 mEq/L (ref 3.5–5.1)
Sodium: 141 mEq/L (ref 135–145)
Total Protein: 6 g/dL (ref 6.0–8.3)

## 2013-01-10 LAB — PROCALCITONIN: Procalcitonin: 0.11 ng/mL

## 2013-01-10 LAB — TSH: TSH: 2.999 u[IU]/mL (ref 0.350–4.500)

## 2013-01-11 ENCOUNTER — Ambulatory Visit: Payer: Self-pay | Admitting: Internal Medicine

## 2013-01-11 DEATH — deceased

## 2013-02-11 DEATH — deceased

## 2013-06-19 IMAGING — CR DG CHEST 2V
1 series · 2 of 2 positions shown · non-contrast
Comparison: none

REASON FOR EXAM: SOB
COMMENTS:

[Series 1: x chest ap · 0.14mm/px · 2 of 2 slices shown]
[im 1/2]
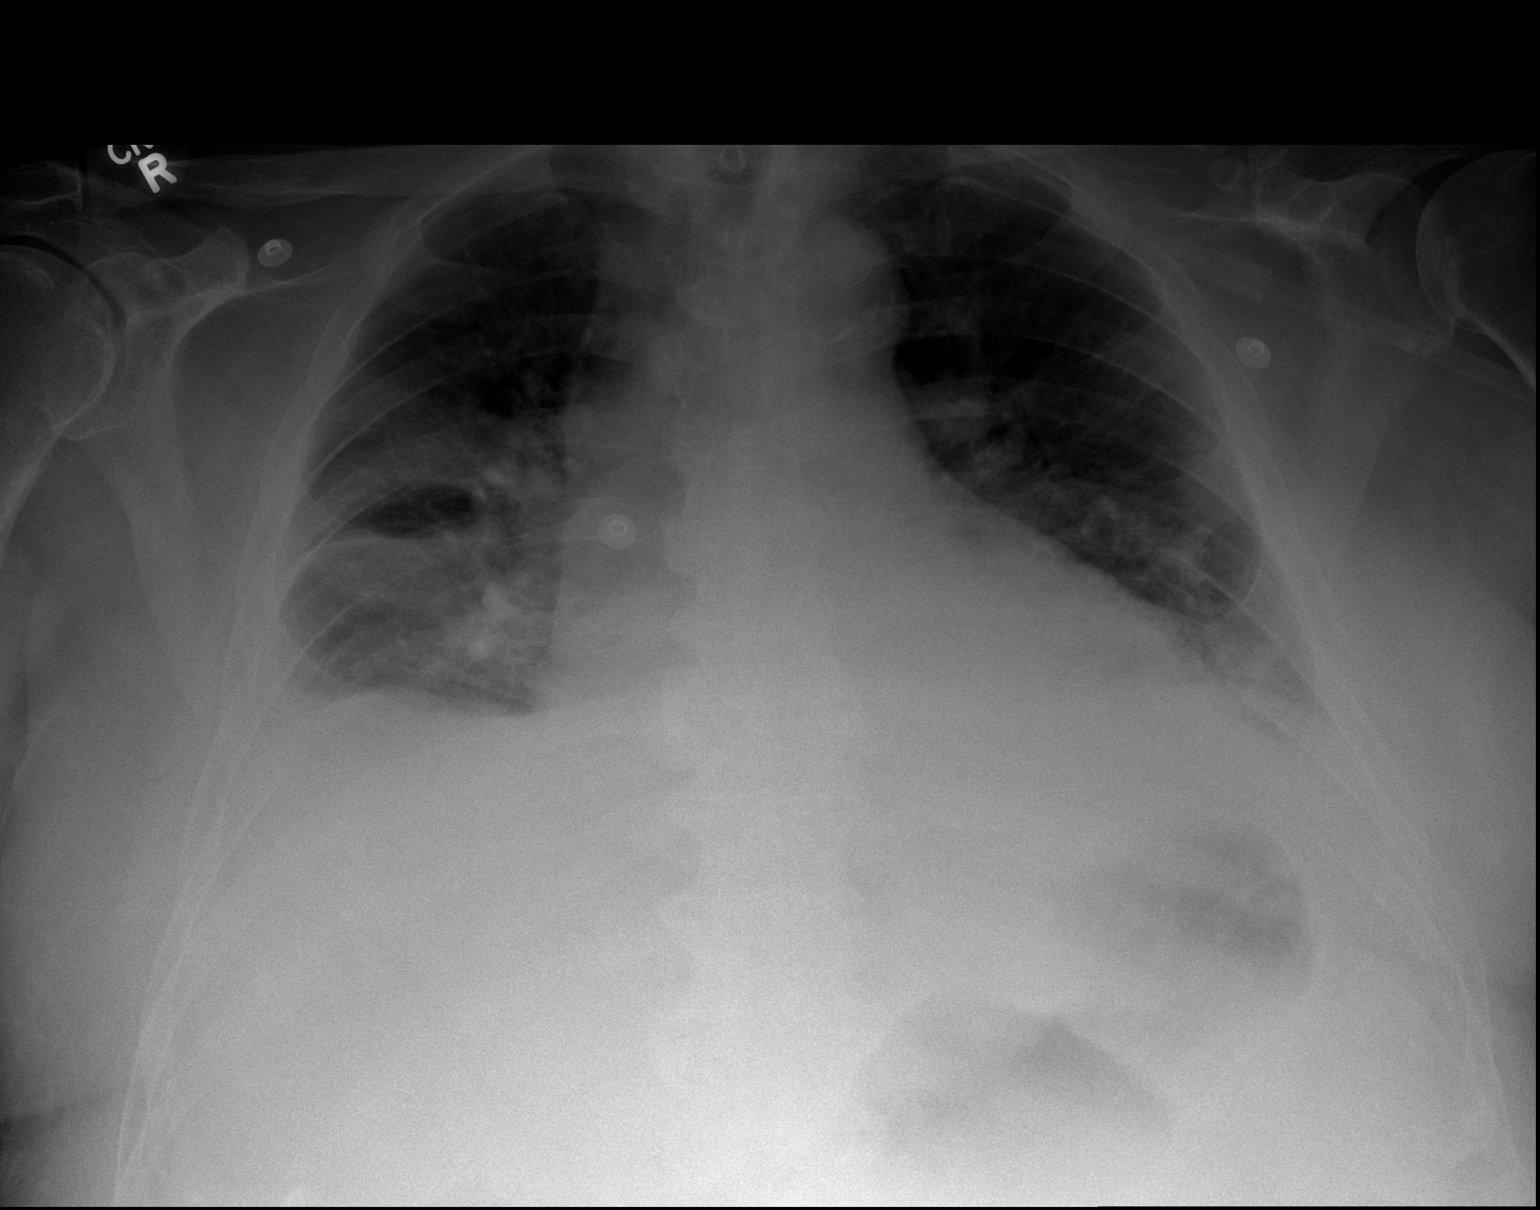
[im 2/2]
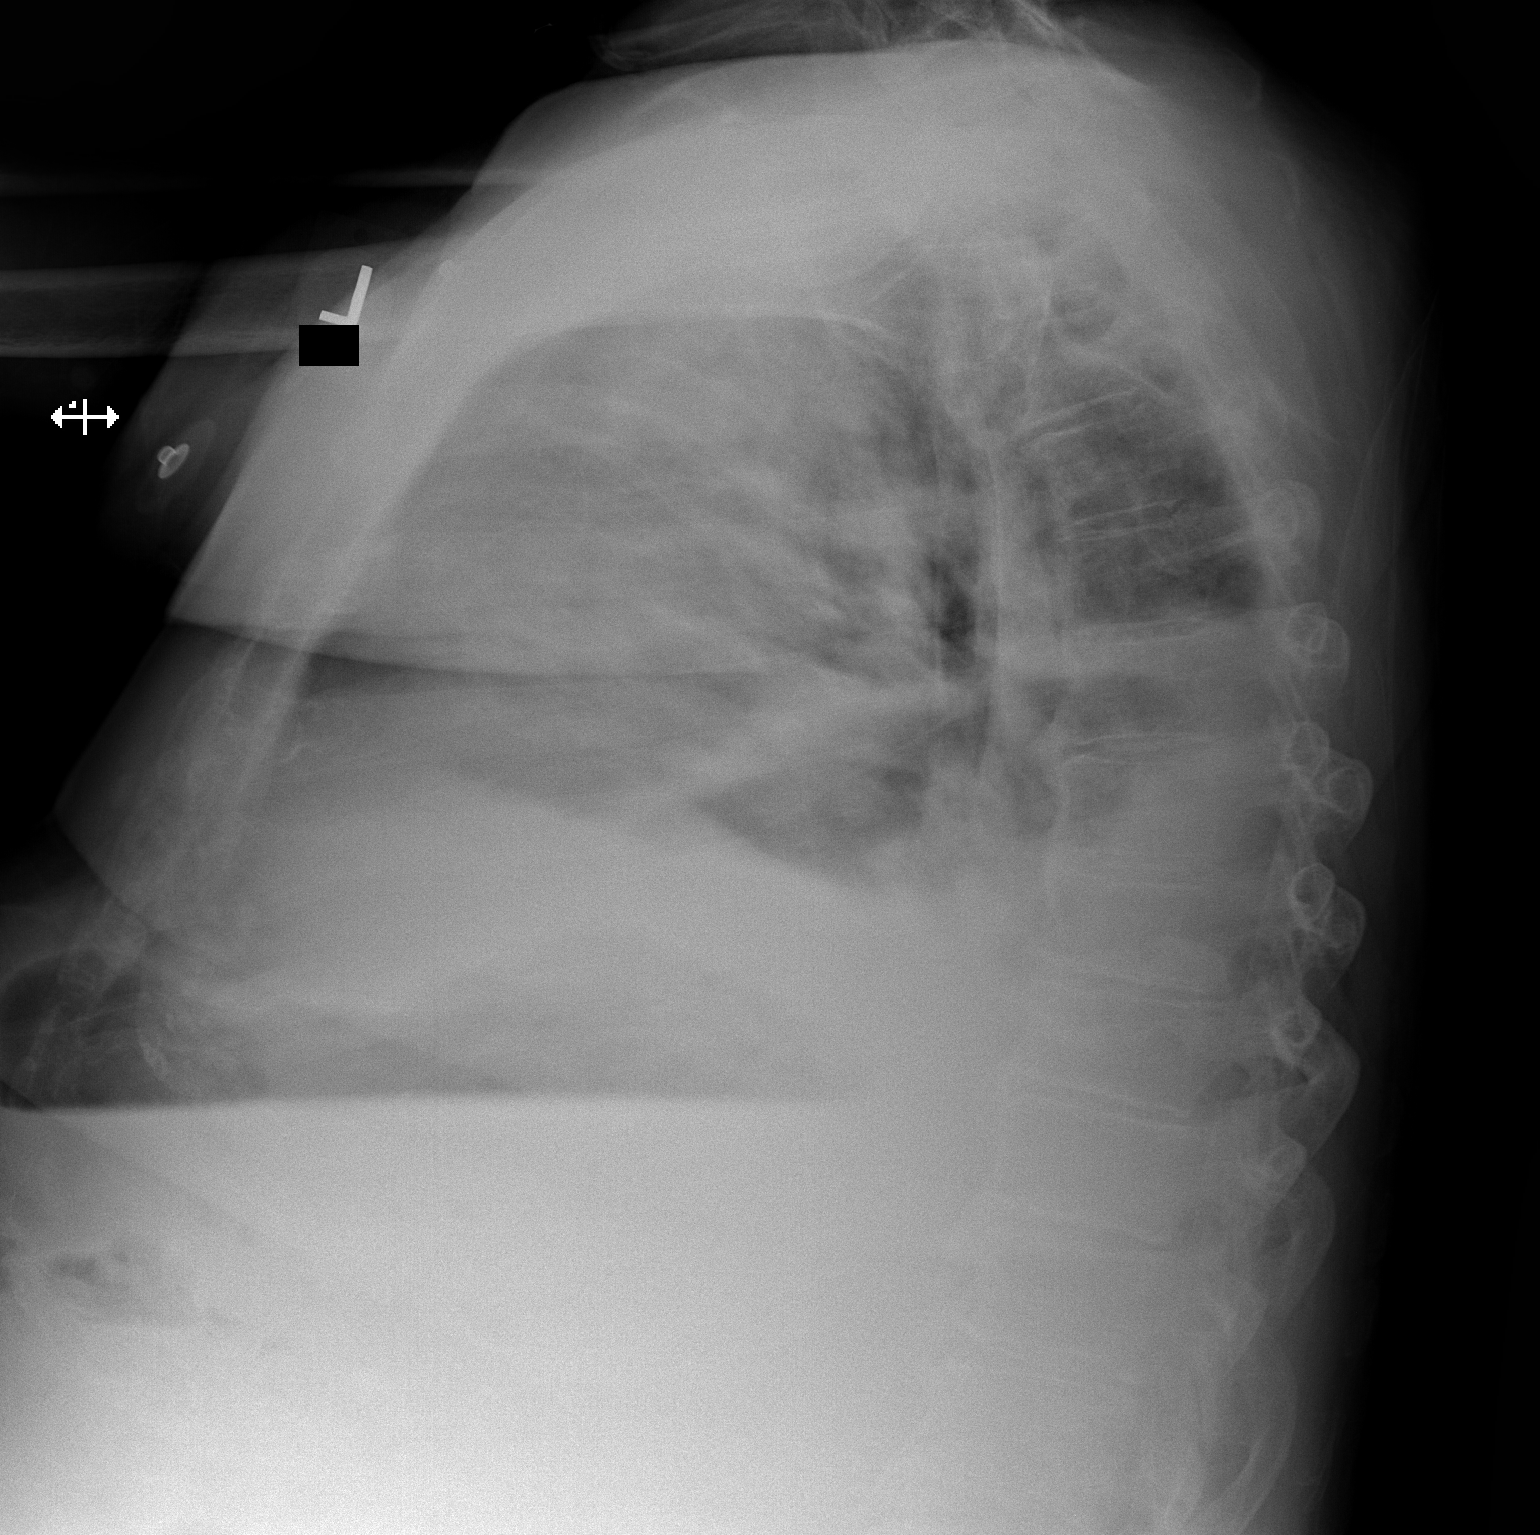

[2 of 2 positions shown; findings below may reference images not displayed]

PROCEDURE:     DXR - DXR CHEST PA (OR AP) AND LATERAL  - December 25, 2012  [DATE]

RESULT:     Comparison is made to the previous exam dated 20 November, 2012.
There is increased density in the inferior half of the right lung with what
appears to be an air-fluid level which could be concerning for cavitation.
An area of uninvolved lung is not excluded. Bilateral pleural effusions are
present the cardiac silhouette is enlarged. There is hypoinflation.
Atherosclerotic calcification is present. Followup chest CT is recommended.
IMPRESSION: Right-sided pneumonia. Possible area of uninvolved lung
versus developing cavitary process. Followup CT is suggested.

[REDACTED]

## 2013-06-21 IMAGING — CR DG CHEST 2V
1 series · 3 of 3 positions shown · non-contrast
Comparison: none

REASON FOR EXAM: follow up pneumonia
COMMENTS:

PROCEDURE:     DXR - DXR CHEST PA (OR AP) AND LATERAL  - December 27, 2012  [DATE]
RESULT:
Comparison is made to a prior study dated 12/25/2012.

[Series 3: x chest ap · 0.14mm/px · 3 of 3 slices shown]
[im 1/3]
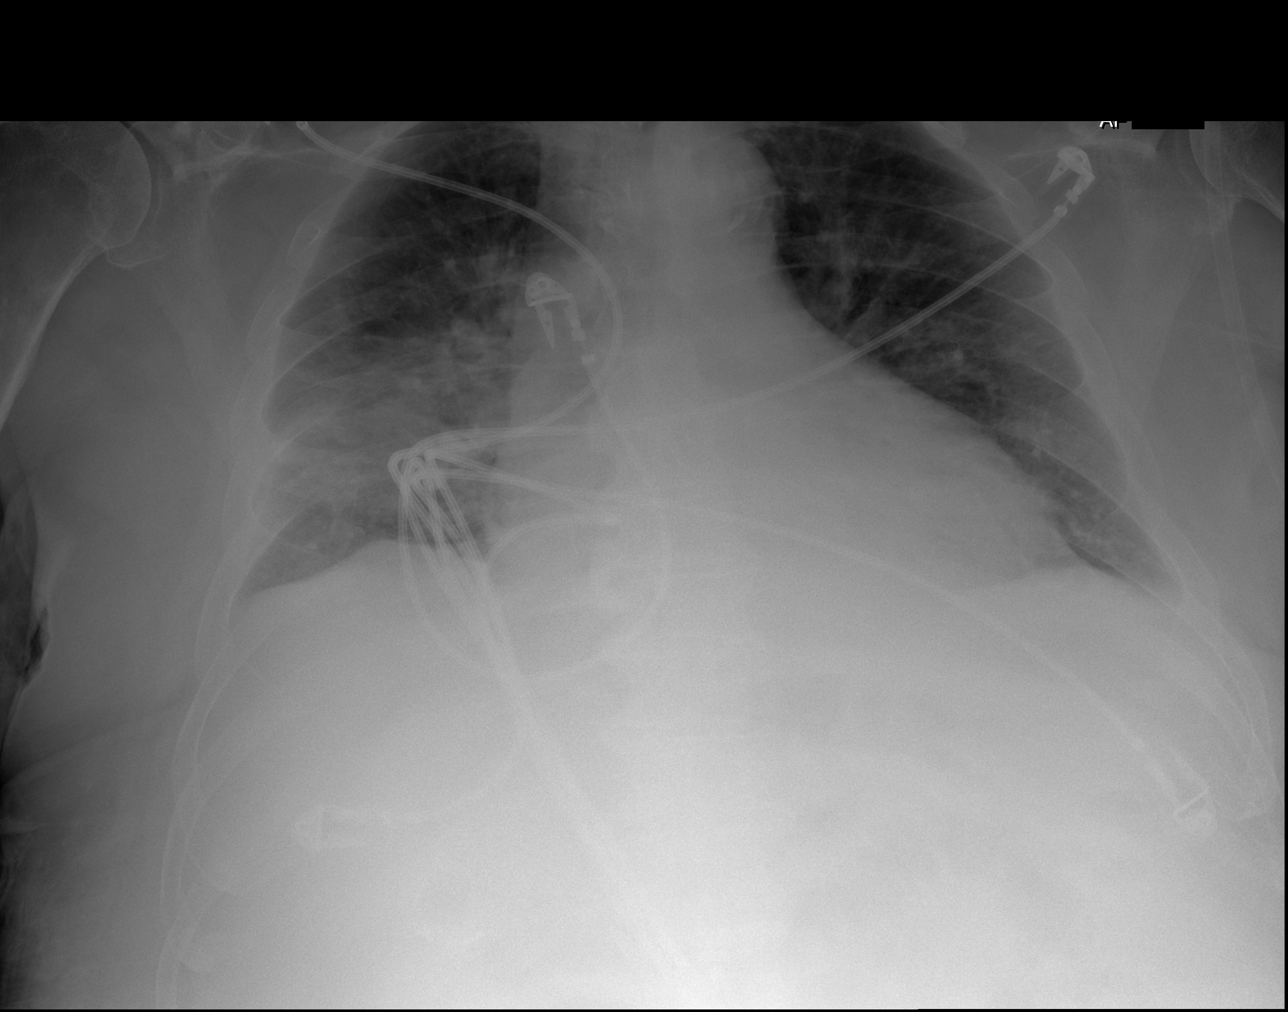
[im 2/3]
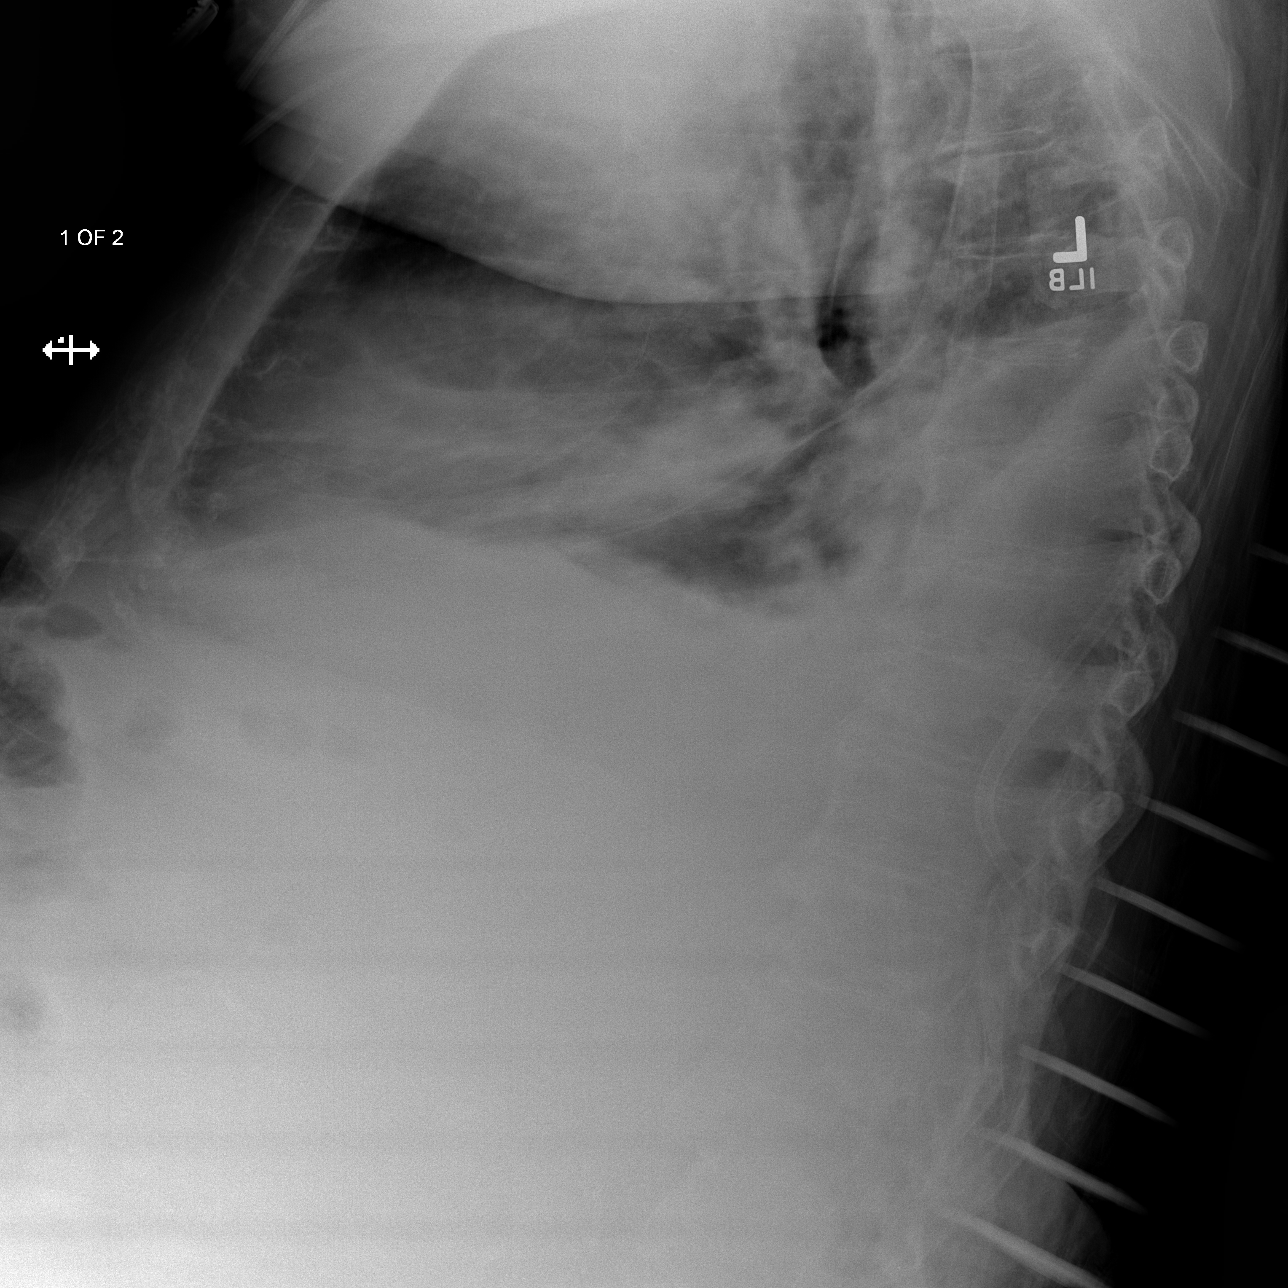
[im 3/3]
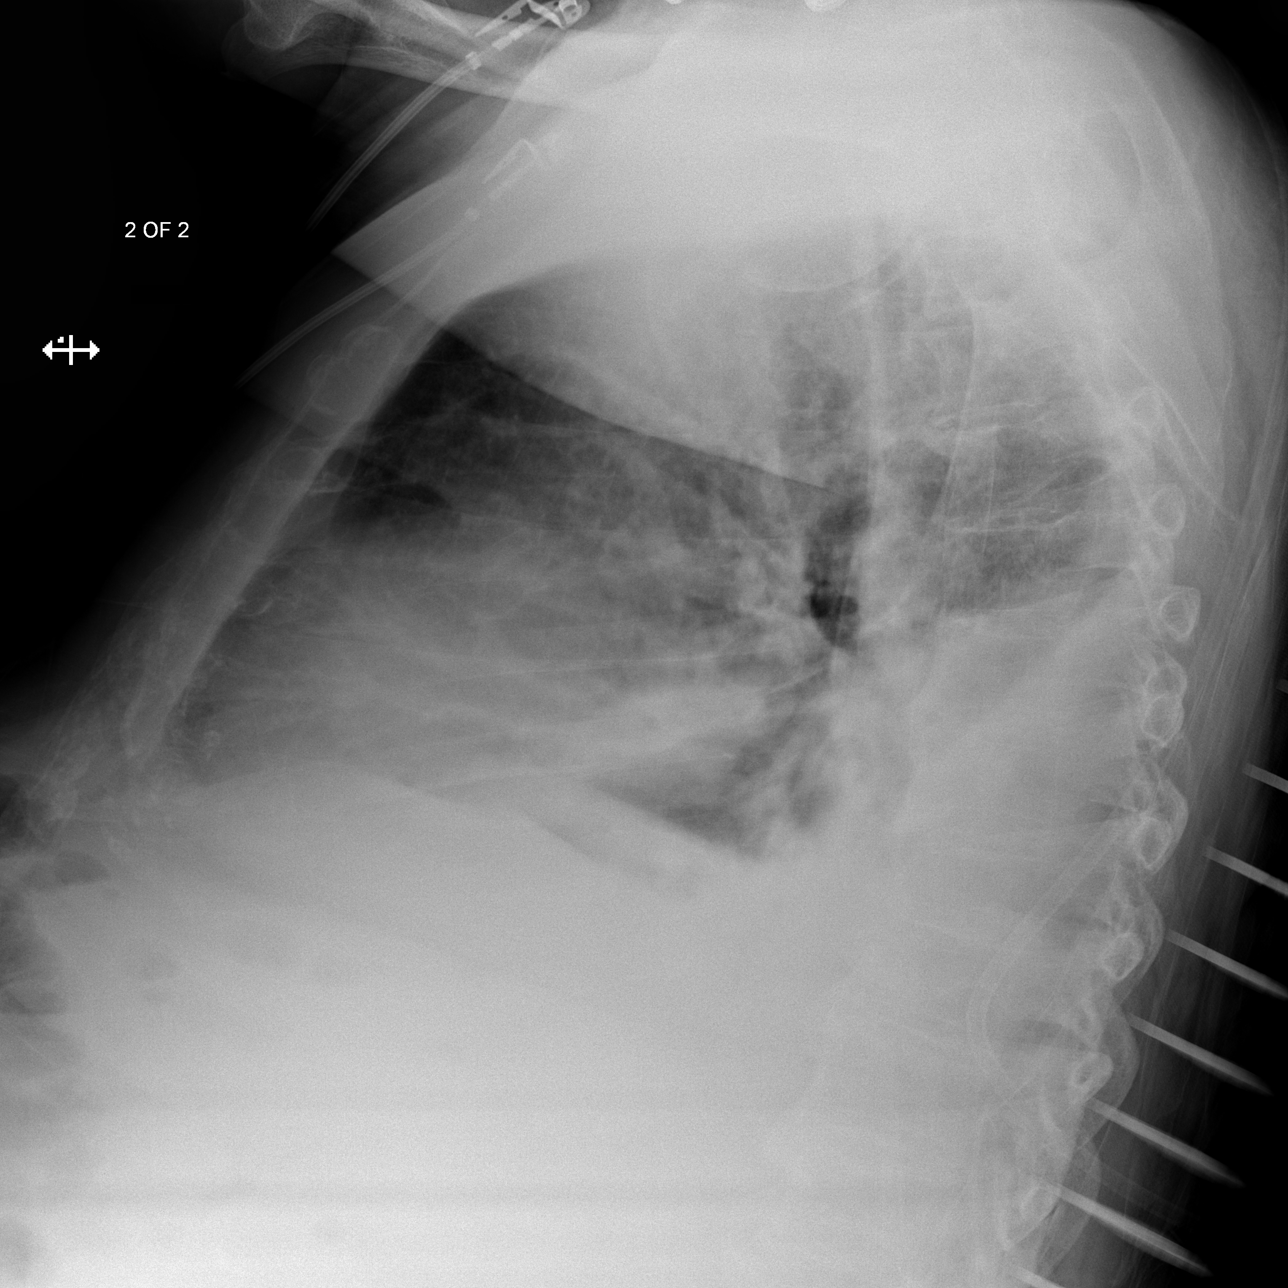

[3 of 3 positions shown; findings below may reference images not displayed]

FINDINGS: There has been slight increased conspicuity of the right lower
lobe infiltrate. The area demonstrates a more confluent appearance. Blunting
of the right costophrenic angle is identified. No new focal regions of
consolidation identified. The cardiac silhouette is enlarged. The patient
has taken a shallow inspiration. There is prominence of the interstitial
markings. The visualized bony skeleton is unremarkable.
IMPRESSION: 1.     Increased confluence of the right lower lobe infiltrate. Note,
radiographic findings may lag behind clinical findings. No new focal regions
of consolidation identified.
2.     Interstitial infiltrate accentuated by the patient's shallow
inspiration though underlying component of pulmonary edema and/or pulmonary
fibrosis is of diagnostic consideration. Continued surveillance evaluation
is recommended.

## 2013-06-25 IMAGING — CR DG CHEST 1V PORT
1 series · 1 of 1 positions shown · non-contrast
Comparison: none

REASON FOR EXAM: pna/pulm edema
COMMENTS:

[ap]
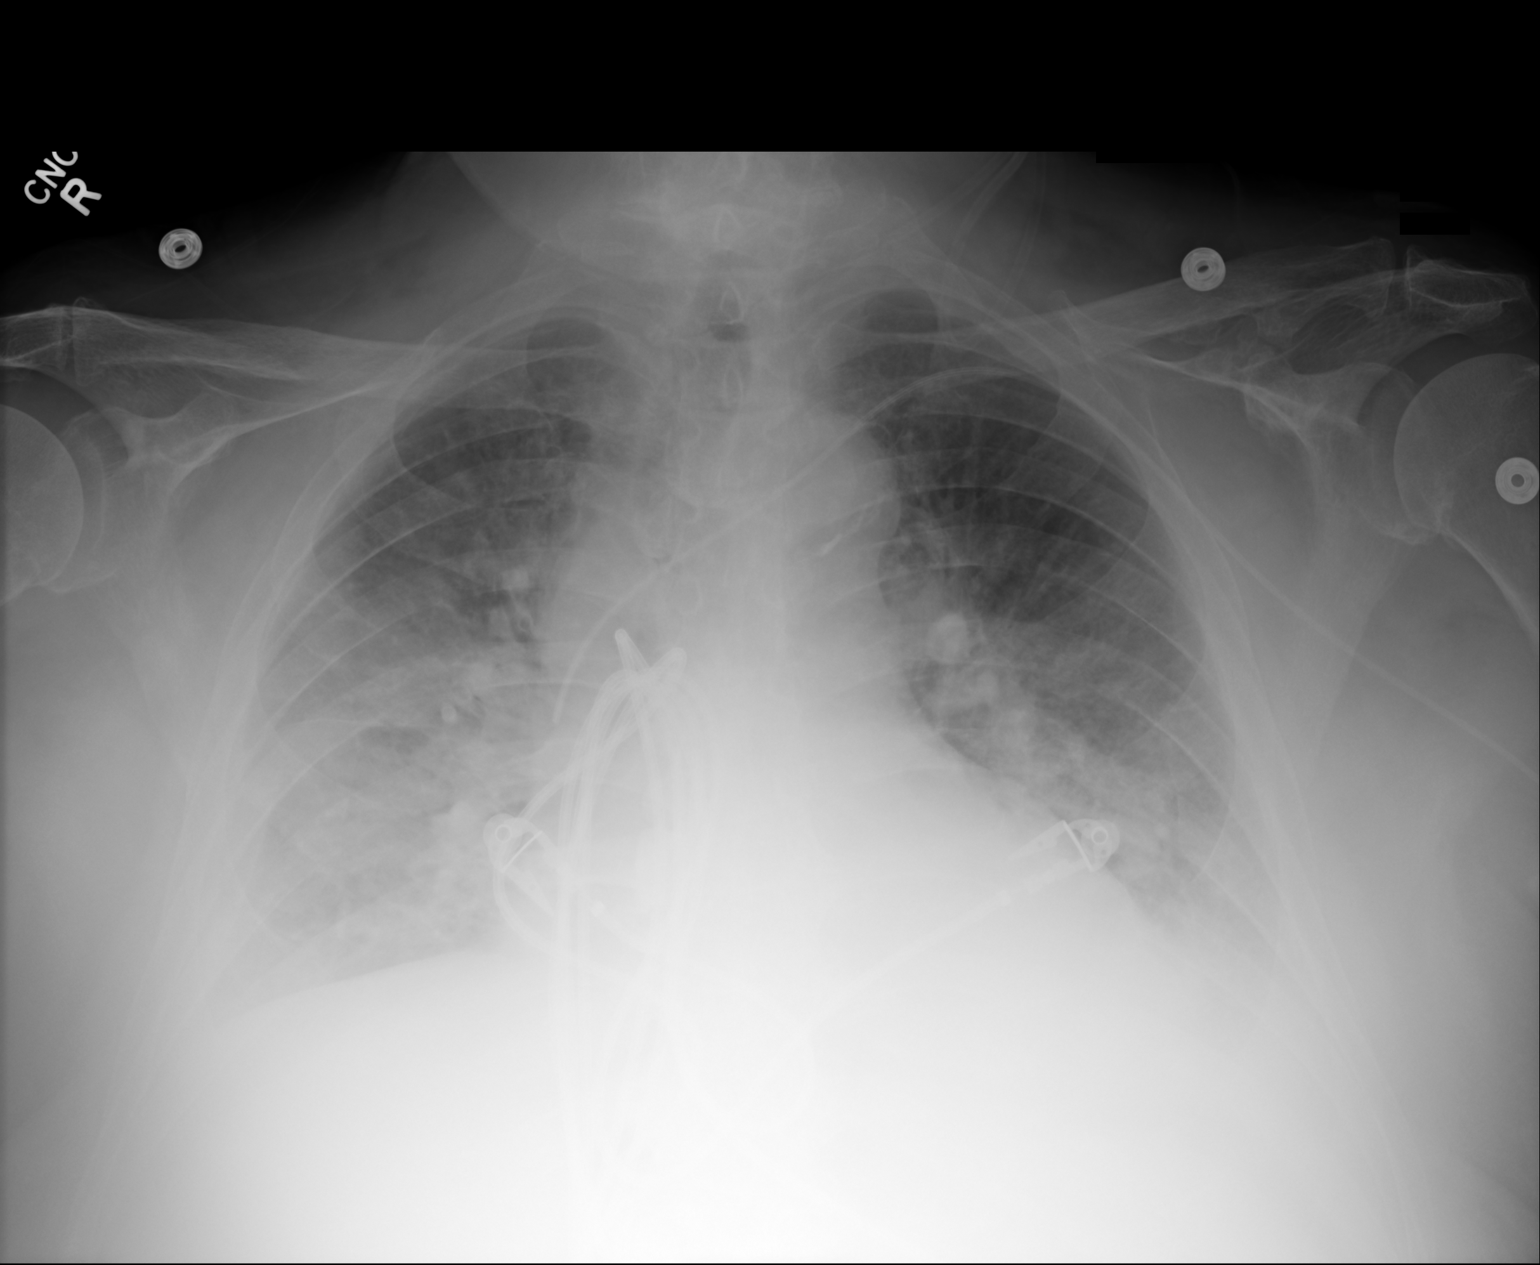

[1 of 1 positions shown; findings below may reference images not displayed]

PROCEDURE:     DXR - DXR PORTABLE CHEST SINGLE VIEW  - December 31, 2012  [DATE]

RESULT:     Comparison is made to the study 27 December, 2012. There is
evidence of diffuse interstitial edema and pulmonary vascular congestion
with cardiomegaly. A left subclavian central venous catheter is present. The
tip projects in the superior vena cava. Cardiac monitoring electrodes are
present. Small effusions may be present.
IMPRESSION: Interval placement of a left subclavian central venous
catheter. Continued changes of congestive heart failure.

[REDACTED]

## 2013-07-05 IMAGING — CR DG CHEST 1V PORT
1 series · 1 of 1 positions shown · non-contrast
Comparison: None.

CLINICAL DATA: Respiratory failure.

PORTABLE CHEST - 1 VIEW

[AP]
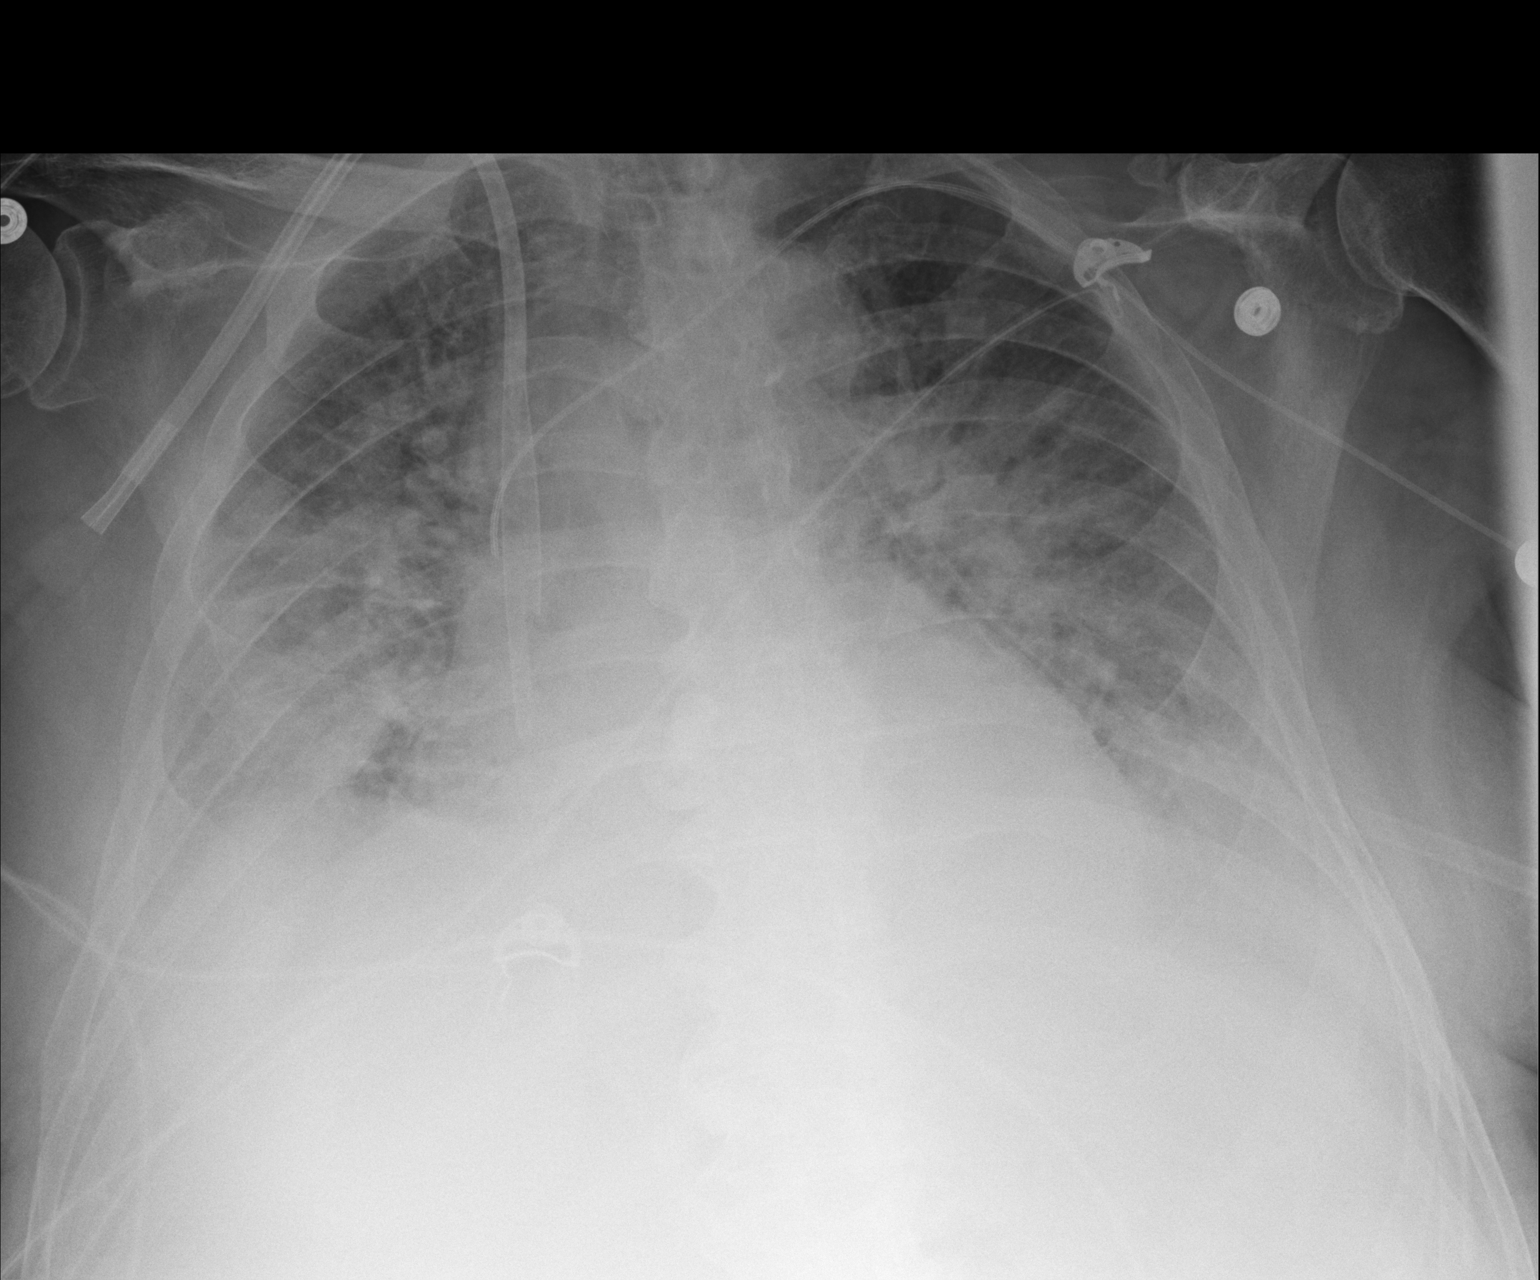

[1 of 1 positions shown; findings below may reference images not displayed]

FINDINGS: Trachea is midline.  Heart size is accentuated by AP semi
upright technique and low lung volumes.  Right IJ dialysis catheter
tips project over the SVC and SVC/RA junction.  Left PICC tip
projects over the SVC.  Diffuse bilateral air space disease and
bilateral pleural effusions.
IMPRESSION: Fairly severe bilateral air space disease and bilateral pleural
effusions are suspicious for congestive heart failure.

## 2015-01-03 NOTE — Consult Note (Signed)
CHIEF COMPLAINT and HISTORY:  Subjective/Chief Complaint renal failure, SOB   History of Present Illness Patient followed for CKD admitted with worsening renal failure.  Cr now >5.  Had hyperkalemia.  Volume overloaded and now needs HD.  Will need catheter for initiation of dialysis.   Past History CKD   ALLERGIES:  Allergies:  catgut: Blisters  HOME MEDICATIONS:  Home Medications: Medication Instructions Status  metoprolol succinate 50 mg oral tablet, extended release 1 tab(s) orally once a day Active  hydrALAZINE 50 mg oral tablet 1 tab(s) orally 4 times a day Active  acetaminophen 325 mg oral tablet 2 tab(s) orally every 4 hours, As needed, temperature >100.5 or pain Active  magnesium hydroxide 8% oral suspension 30 milliliter(s) orally once a day (at bedtime), As needed, constipation Active  ferrous sulfate 325 mg (65 mg elemental iron) oral tablet 1 tab(s) orally 2 times a day (with meals) Active  calcium-vitamin D 500 mg-200 intl units oral tablet 1 tab(s) orally 2 times a day (with meals) Active  omeprazole 20 mg oral delayed release capsule 1 cap(s) orally once a day (in the evening) Active  vitamin E 400 intl units oral capsule 1 cap(s) orally 2 times a day Active  One Tab Daily Multiple Vitamins oral tablet 1 tab(s) orally once a day (in the morning) Active  amlodipine 10 mg oral tablet 1 tab(s) orally once a day (in the evening) Active  aspirin 81 mg oral tablet 1 tab(s) orally once a day (in the morning) Active  Cosamin DS 400 mg-500 mg oral tablet 1 tab(s) orally once a day (in the morning) Active  Vitamin D3 50,000 intl units oral capsule 1 cap(s) orally once a month Active  Norco 325 mg-5 mg oral tablet 1-2 tab(s) orally every 4 hours, As Needed - for Pain Active  Zofran 8 mg oral tablet 1 tab(s) orally 3 times a day, As Needed - for Nausea Active  furosemide 40 mg oral tablet 1 tab(s) orally once a day Active  Lantus Solostar Pen 100 units/mL subcutaneous solution 50  unit(s) subcutaneous once a day Active  NovoLOG FlexPen 100 units/mL subcutaneous solution  subcutaneous sliding scale Active   Family and Social History:  Family History longevity in both parents   Social History negative tobacco, negative ETOH   Place of Living Nursing Home  since hip surgery   Review of Systems:  Fever/Chills No   Cough Yes   Sputum No   Abdominal Pain No   Diarrhea No   Constipation No   Nausea/Vomiting No   SOB/DOE Yes   Chest Pain No   Telemetry Reviewed NSR   Dysuria renal failure   Tolerating PT Yes   Tolerating Diet Yes   Medications/Allergies Reviewed Medications/Allergies reviewed   Physical Exam:  GEN well developed, well nourished   HEENT pink conjunctivae, Oropharynx clear   NECK No masses  trachea midline   RESP postive use of accessory muscles  rhonchi  crackles   CARD LE edema present  no JVD   ABD denies tenderness  denies Flank Tenderness   GU foley catheter in place  clear yellow urine draining   LYMPH negative neck, negative axillae   EXTR negative cyanosis/clubbing, positive edema   SKIN No rashes, skin turgor good   NEURO cranial nerves intact, motor/sensory function intact   PSYCH alert, fair insight   LABS:  Laboratory Results: LabObservation:    14-Apr-14 15:04, Echo Doppler  OBSERVATION   Reason for Test  Hepatic:  14-Apr-14 10:18, Comprehensive Metabolic Panel  Bilirubin, Total 0.2  Alkaline Phosphatase 57  SGPT (ALT) 16  SGOT (AST) 11  Total Protein, Serum 6.7  Albumin, Serum 2.6  Routine Micro:    14-Apr-14 10:18, Blood Culture  Micro Text Report   BLOOD CULTURE    COMMENT                   NO GROWTH IN 48 HOURS     ANTIBIOTIC  Culture Comment   NO GROWTH IN 48 HOURS   Result(s) reported on 27 Dec 2012 at 07:43AM.    14-Apr-14 10:19, Blood Culture  Micro Text Report   BLOOD CULTURE    COMMENT                   NO GROWTH IN 48 HOURS     ANTIBIOTIC  Culture Comment   NO  GROWTH IN 48 HOURS   Result(s) reported on 27 Dec 2012 at 07:44AM.    14-Apr-14 16:48, Clostridium Diff Toxin by RT-PCR  Micro Text Report   CLOS.DIFF ASSAY, RT-PCR    COMMENT                   POSITIVE-CLOS.DIFFICILE TOXIN DETECTED BY PCR     ANTIBIOTIC  Clostridium Diff Toxin by RT-PCR   POSITIVE-CLOS.DIFFICILE TOXIN DETECTED BY PCR ----------------------------------  Test procedure integrates sample purification, nucleic acid  amplification, and detection of the target Clostridium difficile  sequence in simple or complex samples using real-time PCR and  RT-PCR assays.  Cardiology:    14-Apr-14 09:26, ED ECG  Ventricular Rate 54  Atrial Rate 54  P-R Interval 272  QRS Duration 124  QT 420  QTc 398  R Axis -8  T Axis -127  ECG interpretation   Sinus bradycardia with 1st degree A-V block  Left bundle branch block  Abnormal ECG  When compared with ECG of 04-Dec-2012 02:45,  PR interval has increased  Left bundle branch block is now Present  Criteria for Septal infarct are no longer Present  ----------unconfirmed----------  Confirmed by OVERREAD, NOT ([id number]), editor PEARSON, BARBARA ([id number]) on 12/26/2012 12:52:49 PM  ED ECG     14-Apr-14 15:04, Echo Doppler  Echo Doppler   REASON FOR EXAM:      COMMENTS:       PROCEDURE: [facility] - ECHO DOPPLER COMPLETE(TRANSTHOR)  - Dec 25 2012  3:04PM     RESULT: Echocardiogram Report    Patient Name:   [name] [name] Date of Exam: 12/25/2012  Medical Rec #:  [id number]        Custom1:  Date of Birth:  09-Feb-1926                  Height:       74.0 in  Patient Age:    81 years                   Weight:       230.0 lb  Patient Gender: M                          BSA:          2.31 m??    Indications: CHF  Sonographer:  [place]  Referring Phys: [place] Lighter    Summary:   1. Bradycardia with ectopy noted.   2. Left ventricular ejection fraction, by visual estimation, is 50 to   55%.  3. Mild left ventricular  hypertrophy.   4. Normal right ventricularsize and systolic function.   5. Mildly dilated left atrium.   6. Normal RVSP.  2D AND M-MODE MEASUREMENTS (normal ranges within parentheses):  Left Ventricle:          Normal  IVSd (2D):      1.39 cm (0.7-1.1)  LVPWd (2D):     1.22 cm (0.7-1.1) Aorta/LA:                  Normal  LVIDd (2D):     4.98 cm (3.4-5.7) Aortic Root (2D): 3.10 cm (2.4-3.7)  LVIDs (2D):     3.49 cm           Left Atrium (2D): 4.90 cm (1.9-4.0)  LV FS (2D):     29.9 %   (>25%)  LV EF (2D):     56.9 %   (>50%)                       Right Ventricle:                                    RVd (2D):  LV DIASTOLIC FUNCTION:  MV Peak E: 1.17 m/s E/e' Ratio: 23.50  MV Peak A: 0.92 m/s Decel Time: 290 msec  E/A Ratio: 1.27  SPECTRAL DOPPLER ANALYSIS (where applicable):  Mitral Valve:  MV P1/2 Time: 84.10 msec  MV Area, PHT: 2.62 cm??  Aortic Valve: AoV Max Vel: 1.36 m/s AoV Peak PG: 7.4 mmHg AoV Mean PG:  LVOT Vmax: 0.76 m/s LVOT VTI:  LVOT Diameter: 2.00 cm  AoV Area, Vmax: 1.74 cm?? AoV Area, VTI:  AoV Area, Vmn:  Tricuspid Valve and PA/RV Systolic Pressure: TR Max Velocity: 1.76 m/s RA   Pressure: 5 mmHg RVSP/PASP: 17.4 mmHg  Pulmonic Valve:  PV Max Velocity: 1.34 m/s PV Max PG: 7.2 mmHg PV Mean PG:    PHYSICIAN INTERPRETATION:  Left Ventricle: The left ventricular internal cavity size was normal. LV   posterior wall thickness was normal. Mild left ventricular hypertrophy.   Left ventricular ejection fraction, by visual estimation, is 50 to 55%.   Spectral Doppler shows normal pattern of LV diastolic filling.  Right Ventricle: Normal right ventricular size, wall thickness, and   systolic function. The right ventricular size is normal. Global RV   systolic function is normal.  Left Atrium: The left atrium is mildly dilated.  Right Atrium: The right atrium is normal in size.  Pericardium: There is no evidence of pericardial effusion.  Mitral Valve: Structurally normal  mitral valve, with normal leaflet   excursion; without any evidence of mitral stenosis or significant   regurgitation. The mitral valve is normal in structure. Mild mitral valve   regurgitation is seen. MAC.  Tricuspid Valve: Structurally normal tricuspid valve, with normal leaflet   excursion. The tricuspid valve is normal. Mild tricuspid regurgitation is   visualized. The tricuspid regurgitant velocity is 1.76 m/s, and with an   assumed right atrial pressure of 5 mmHg, the estimated right ventricular   systolic pressure is normal at 17.4 mmHg.  Aortic Valve: The aortic valve is structurally normal, with no evidence   of sclerosis or stenosis. No evidence of aortic valve regurgitation is   seen.  Pulmonic Valve: Structurally normal pulmonic valve, with normal leaflet     excursion. The pulmonic valve is normal. Trace pulmonic  valve   regurgitation.  Aorta: The aortic root is normal in size and structure.    [id number] Ida Rogue MD  Electronically signed by [id number] Ida Rogue MD  Signature Date/Time: 12/26/2012/8:14:20 AM    *** Final ***    IMPRESSION: .        Verified By: Minna Merritts, M.D., MD  Routine Chem:    14-Apr-14 10:18, B-Type Natriuretic Peptide [facility])  B-Type Natriuretic Peptide [facility]) [phone]  Result(s) reported on 25 Dec 2012 at 11:00AM.    14-Apr-14 10:18, Comprehensive Metabolic Panel  Glucose, Serum 169  BUN 83  Creatinine (comp) 4.66  Sodium, Serum 137  Potassium, Serum 6.9  Chloride, Serum 109  CO2, Serum 22  Calcium (Total), Serum 8.5  Osmolality (calc) 303  eGFR (African American) 12  eGFR (Non-African American) 11  eGFR values <64m/min/1.73 m2 may be an indication of chronic  kidney disease (CKD).  Calculated eGFR is useful in patients with stable renal function.  The eGFR calculation will not be reliable in acutely ill patients  when serum creatinine is changing rapidly. It is not useful in   patients on dialysis. The eGFR calculation  may not be applicable  to patients at the low and high extremes of body sizes, pregnant  women, and vegetarians.  Result Comment   POTASSIUM/TROPONIN - RESULTS VERIFIED BY REPEAT TESTING.   - NOTIFIED OF CRITICAL VALUE   - C/ AMY TEAGUE _0  12-25-12..Marland KitchenJO   - READ-BACK PROCESS PERFORMED.   Result(s) reported on 25 Dec 2012 at 10:33AM.  Anion Gap 6    14-Apr-14 16:48, Clostridium Diff Toxin by RT-PCR  Result Comment   c.diff - READ-BACK PROCESS PERFORMED.   - NOTIFIED OF CRITICAL VALUE   - c/doll ferguson 4/14/14at 1830.nbb   Result(s) reported on 25 Dec 2012 at 06:33PM.    14-Apr-14 121:19 Basic Metabolic Panel (w/Total Calcium)  Glucose, Serum 132  BUN 82  Creatinine (comp) 4.90  Sodium, Serum 139  Potassium, Serum 6.7  Chloride, Serum 107  CO2, Serum 23  Calcium (Total), Serum 8.4  Anion Gap 9  Osmolality (calc) 304  eGFR (African American) 12  eGFR (Non-African American) 10  eGFR values <668mmin/1.73 m2 may be an indication of chronic  kidney disease (CKD).  Calculated eGFR is useful in patients with stable renal function.  The eGFR calculation will not be reliable in acutely ill patients  when serum creatinine is changing rapidly. It is not useful in   patients on dialysis. The eGFR calculation may not be applicable  to patients at the low and high extremes of body sizes, pregnant  women, and vegetarians.  Result Comment   POTASSIUM - RESULTS VERIFIED BY REPEAT TESTING.   - NOTIFIED OF CRITICAL VALUE   - CALLED TO KASEY WOODELL @ 2002 ON 4/14   - 2014/JAB/CAF   - READ-BACK PROCESS PERFORMED.   Result(s) reported on 25 Dec 2012 at 07:48PM.    14-Apr-14 18:37, Troponin I  Result Comment   TROPONIN - RESULTS VERIFIED BY REPEAT TESTING.   - PREV. CALLED ON 12/25/12 @ 1101 BY AJO/   - CAF   Result(s) reported on 25 Dec 2012 at 09:23PM.    15-Apr-14 0141:74Basic Metabolic Panel (w/Total Calcium)  Glucose, Serum 125  BUN 86  Creatinine (comp) 5.02  Sodium, Serum 138   Potassium, Serum 6.0  Chloride, Serum 110  CO2, Serum 23  Calcium (Total), Serum 8.5  Anion Gap 5  Osmolality (calc) 303  eGFR (African American)  11  eGFR (Non-African American) 10  eGFR values <52m/min/1.73 m2 may be an indication of chronic  kidney disease (CKD).  Calculated eGFR is useful in patients with stable renal function.  The eGFR calculation will not be reliable in acutely ill patients  when serum creatinine is changing rapidly. It is not useful in   patients on dialysis. The eGFR calculation may not be applicable  to patients at the low and high extremes of body sizes, pregnant  women, and vegetarians.    15-Apr-14 01:56, Troponin I  Result Comment   TROPONIN - RESULTS VERIFIED BY REPEAT TESTING.   - PREVIOUS CALL:12/25/12_0 ..Marland KitchenMarland KitchenPL   Result(s) reported on 26 Dec 2012 at 0[facility]    15-Apr-14 07:20, Potassium, Serum  Potassium, Serum 5.8  Result(s) reported on 26 Dec 2012 at 07:40AM.    15-Apr-14 14:34, Potassium, Serum  Potassium, Serum 5.8  Result(s) reported on 26 Dec 2012 at 02:58PM.    15-Apr-14 21:15, Potassium, Serum  Potassium, Serum 5.6  Result(s) reported on 26 Dec 2012 at 09:38PM.    16-Apr-14 002:33 Basic Metabolic Panel (w/Total Calcium)  Glucose, Serum 128  BUN 83  Creatinine (comp) 5.52  Sodium, Serum 140  Potassium, Serum 5.3  Chloride, Serum 109  CO2, Serum 22  Calcium (Total), Serum 8.2  Anion Gap 9  Osmolality (calc) 306  eGFR (African American) 10  eGFR (Non-African American) 9  eGFR values <667mmin/1.73 m2 may be an indication of chronic  kidney disease (CKD).  Calculated eGFR is useful in patients with stable renal function.  The eGFR calculation will not be reliable in acutely ill patients  when serum creatinine is changing rapidly. It is not useful in   patients on dialysis. The eGFR calculation may not be applicable  to patients at the low and high extremes of body sizes, pregnant  women, and vegetarians.    17-Apr-14 0443:56 Basic Metabolic Panel (w/Total Calcium)  Glucose, Serum 94  BUN 81  Creatinine (comp) 5.07  Sodium, Serum 143  Potassium, Serum 4.6  Chloride, Serum 113  CO2, Serum 23  Calcium (Total), Serum 7.5  Anion Gap 7  Osmolality (calc) 309  eGFR (African American) 11  eGFR (Non-African American) 10  eGFR values <60102min/1.73 m2 may be an indication of chronic  kidney disease (CKD).  Calculated eGFR is useful in patients with stable renal function.  The eGFR calculation will not be reliable in acutely ill patients  when serum creatinine is changing rapidly. It is not useful in   patients on dialysis. The eGFR calculation may not be applicable  to patients at the low and high extremes of body sizes, pregnant  women, and vegetarians.  Result Comment   MET B - Slight hemolysis, interpret results with   - caution...tpl   Result(s) reported on 28 Dec 2012 at 04:51AM.  Cardiac:    14-Apr-14 10:18, Cardiac Panel  CK, Total 20  CPK-MB, Serum 2.2  Result(s) reported on 25 Dec 2012 at 01:32PM.    14-Apr-14 10:18, Troponin I  Troponin I 0.66  0.00-0.05  0.05 ng/mL or less: NEGATIVE   Repeat testing in 3-6 hrs   if clinically indicated.  >0.05 ng/mL: POTENTIAL   MYOCARDIAL INJURY. Repeat   testing in 3-6 hrs if   clinically indicated.  NOTE: An increase or decrease   of 30% or more on serial   testing suggests a   clinically important change    14-Apr-14 18:37, Cardiac Panel  CK, Total 20  CPK-MB, Serum  2.4  Result(s) reported on 25 Dec 2012 at 09:20PM.    14-Apr-14 18:37, Troponin I  Troponin I 0.78  0.00-0.05  0.05 ng/mL or less: NEGATIVE   Repeat testing in 3-6 hrs   if clinically indicated.  >0.05 ng/mL: POTENTIAL   MYOCARDIAL INJURY. Repeat   testing in 3-6 hrs if   clinically indicated.  NOTE: An increase or decrease   of 30% or more on serial   testing suggests a   clinically important change    15-Apr-14 01:56, Cardiac Panel  CK, Total 17  CPK-MB, Serum 2.2   Result(s) reported on 26 Dec 2012 at 02:35AM.    15-Apr-14 01:56, Troponin I  Troponin I 0.77  0.00-0.05  0.05 ng/mL or less: NEGATIVE   Repeat testing in 3-6 hrs   if clinically indicated.  >0.05 ng/mL: POTENTIAL   MYOCARDIAL INJURY. Repeat   testing in 3-6 hrs if   clinically indicated.  NOTE: An increase or decrease   of 30% or more on serial   testing suggests a   clinically important change  Routine Hem:    14-Apr-14 10:18, CBC Profile  WBC (CBC) 7.9  RBC (CBC) 3.07  Hemoglobin (CBC) 9.2  Hematocrit (CBC) 29.4  Platelet Count (CBC) 273  MCV 96  MCH 30.0  MCHC 31.4  RDW 16.3  Neutrophil % 83.6  Lymphocyte % 6.1  Monocyte % 8.5  Eosinophil % 1.0  Basophil % 0.8  Neutrophil # 6.6  Lymphocyte # 0.5  Monocyte # 0.7  Eosinophil # 0.1  Basophil # 0.1  Result(s) reported on 25 Dec 2012 at 10:33AM.    15-Apr-14 01:56, CBC Profile  WBC (CBC) 7.7  RBC (CBC) 2.88  Hemoglobin (CBC) 8.6  Hematocrit (CBC) 27.5  Platelet Count (CBC) 252  MCV 96  MCH 30.0  MCHC 31.4  RDW 16.4  Neutrophil % 72.9  Lymphocyte % 13.4  Monocyte % 9.7  Eosinophil % 2.7  Basophil % 1.3  Neutrophil # 5.6  Lymphocyte # 1.0  Monocyte # 0.7  Eosinophil # 0.2  Basophil # 0.1  Result(s) reported on 26 Dec 2012 at 02:13AM.    16-Apr-14 04:23, WBC  WBC (CBC) 6.7  Result(s) reported on 27 Dec 2012 at 05:26AM.   RADIOLOGY:  Radiology Results: XRay:    07-Mar-14 13:45, Chest 1 View AP or PA  Chest 1 View AP or PA  REASON FOR EXAM:    PREOP  COMMENTS:       PROCEDURE: DXR - DXR CHEST 1 VIEWAP OR PA  - Nov 17 2012  1:45PM     RESULT: Comparison: 08/15/2009    Findings:  The heart and mediastinum are stable. The lung volumes are slightly   diminished.    IMPRESSION:   No acute cardiopulmonary disease.      Verified By: Gregor Hams, M.D., MD    07-Mar-14 13:45, Hip Left Complete  Hip Left Complete  REASON FOR EXAM:    fall, hip pain  COMMENTS:       PROCEDURE: DXR - DXR HIP  LEFT COMPLETE  - Nov 17 2012  1:45PM     RESULT: Comparison: None.    Findings:  There is a displaced fracture of the left femoral neck. Vascular   calcifications are present. Vascular stent is seen in the left thigh.    IMPRESSION:   Displaced left femoral neck fracture.      Verified By: Gregor Hams, M.D., MD    [phone]  12:17, Hip Left One View  Hip Left One View  REASON FOR EXAM:    post op  COMMENTS:   Bedside (portable):Y    PROCEDURE: DXR - DXR HIP LEFT ONE VIEW  - Nov 18 2012 12:17PM     RESULT: Crosstable lateral view of the left hip in a post arthroplasty   patient shows no immediate postoperative bone or hardware complication.   Skin staples and surgical drains are present.    IMPRESSION:  Please see above.    Dictation Site: 6        Verified By: Sundra Aland, M.D., MD    08-Mar-14 12:17, Pelvis AP Only  Pelvis AP Only  REASON FOR EXAM:    post op in PACU  COMMENTS:   Bedside (portable):Y    PROCEDURE: DXR - DXR PELVIS AP ONLY  - Nov 18 2012 12:17PM     RESULT: The patient is status post left hip arthroplasty. Skin staples   are present. Surgical drains are placed. There is no postoperative bone   or hardware complication. Atherosclerotic calcification is present.    IMPRESSION:  Please see above.    Dictation Site: 6        Verified By: Sundra Aland, M.D., MD    10-Mar-14 12:03, Chest Portable Single View  Chest Portable Single View  REASON FOR EXAM:    hypoxia post-op  COMMENTS:       PROCEDURE: DXR - DXR PORTABLE CHEST SINGLE VIEW  - Nov 20 2012 12:03PM     RESULT: Frontal view the chest dated 11/20/2012 comparison made to prior   study dated 11/17/2012    Findings: The patient has taken a shallow inspiration. No focal recent   consolidation of appreciated. There is mild prominence of interstitial   markings. The cardiac silhouette is the upper limits of normal.    IMPRESSION:  Shallow inspiration which accentuates the lung  findings   though there is mild prominence of interstitial markings may represent   underlying component of pulmonary vascular congestion/mild edema. No     focal regions of consolidation nor focal infiltrates are appreciated.        Verified By: Annie Main. Burt Knack, M.D., MD    14-Apr-14 09:38, Chest PA and Lateral  Chest PA and Lateral  REASON FOR EXAM:    SOB  COMMENTS:       PROCEDURE: DXR - DXR CHEST PA (OR AP) AND LATERAL  - Dec 25 2012  9:38AM     RESULT: Comparison is made to the previous exam dated 20 November 2012.   There is increased density in the inferior half of the right lung with   what appears to be an air-fluid level which could be concerning for   cavitation. An area of uninvolved lung is not excluded. Bilateral pleural   effusions are present the cardiac silhouette is enlarged. There is   hypoinflation. Atherosclerotic calcification is present. Followup chest   CT is recommended.    IMPRESSION:  Right-sided pneumonia. Possible area of uninvolved lung   versus developing cavitary process. Followup CT is suggested.  Dictation Site: 2        Verified By: Sundra Aland, M.D., MD    16-Apr-14 07:56, Chest PA and Lateral  Chest PA and Lateral  REASON FOR EXAM:    follow up pneumonia  COMMENTS:       PROCEDURE: DXR - DXR CHEST PA (OR AP) AND LATERAL  - Dec 27 2012  7:56AM     RESULT:     Comparison is made to a prior study dated 12/25/2012.    Findings:  There has been slight increased conspicuity of the right lower   lobe infiltrate. The area demonstrates a more confluent appearance.   Blunting of the right costophrenic angle is identified. No new focal   regions of consolidation identified. The cardiac silhouette is enlarged.   The patient has taken a shallow inspiration. There is prominence of the   interstitial markings. The visualized bony skeleton is unremarkable.  IMPRESSION:      1. Increased confluence of the right lower lobe infiltrate. Note,    radiographic findings may lagbehind clinical findings. No new focal   regions of consolidation identified.   2. Interstitial infiltrate accentuated by the patient's shallow   inspiration though underlying component of pulmonary edema and/or   pulmonary fibrosis is of diagnostic consideration. Continued surveillance   evaluation is recommended.        Thank you for this opportunity to contribute to the care of your patient.     Verified By: Mikki Santee, M.D., MD  [place]:    14-Apr-14 15:51, US Kidney Bilateral  US Kidney Bilateral  REASON FOR EXAM:    acute renal failure  COMMENTS:       PROCEDURE: [place]  - US KIDNEY  - Dec 25 2012  3:51PM     RESULT: Renal sonogram shows the right kidney measures 10.77 x 4.82 x   5.45 cm. The cortical thickness is 12 mm. The left kidney measures 9.58 x   5.78 x 538 cm. The thickness of the cortex was not measured on the left.   There is a Foley balloon in the bladder.    IMPRESSION:  No acute abnormality the kidneys. This cyst mentioned that   CT is not visualized on today's ultrasound. The study is somewhat   complicated by the patient's body habitus and limited mobility.    Dictation Site: 2    Verified By: Sundra Aland, M.D., MD  LabUnknown:    07-Mar-14 13:45, Chest 1 View AP or PA  PACS Image    07-Mar-14 13:45, Hip Left Complete  PACS Image    08-Mar-14 12:17, Hip Left One View  PACS Image    08-Mar-14 12:17, Pelvis AP Only  PACS Image    10-Mar-14 12:03, Chest Portable Single View  PACS Image    21-Mar-14 06:41, CT Abdomen and Pelvis Without Contrast  PACS Image    14-Apr-14 09:38, Chest PA and Lateral  PACS Image    14-Apr-14 11:28, CT Chest Without Contrast  PACS Image    14-Apr-14 15:51, US Kidney Bilateral  PACS Image    16-Apr-14 07:56, Chest PA and Lateral  PACS Image  CT:    21-Mar-14 06:41, CT Abdomen and Pelvis Without Contrast  CT Abdomen and Pelvis Without Contrast  REASON FOR EXAM:    (1) LLQ pain  vomiting, diarrhea; (2) LLQ pain   vomiting, diarrhea  COMMENTS:       PROCEDURE: CT  - CT ABDOMEN AND PELVIS W0  - Dec 01 2012  6:41AM     RESULT: History: Left lower quadrant pain.    Comparison Study: Prior CT of 08/02/2009.    Findings: Standard scan obtained. Bilateral small pleural effusions   present. Atelectasis versus pneumonia lung bases. Coronary artery   disease. Liver normal. Cholecystectomy. Spleen normal. Pancreas normal.   Adrenals normal. Hyperdense right renal lesion noted most likely  hyperdense cysts. Unchanged. Mild hydroureter mild/moderate hydroureter     right is noted. This is unchanged. No hydronephrosis no. Aorta normal   caliber. No adenopathy. No inflammatory change in right or left lower   quadrant. No bowel distention. Fluid is no the colon. Diarrheal illness   could present this fashion. No free air. Small amount of air noted in the   bladder. Left hip replacement.    IMPRESSION:   1. Atelectasis versus infiltrate both lungbases with small pleural   effusions.  2. Fluid present in the colon. This could be from a diarrheal illness.   3. Stable hydroureter on the right.   4. Small amount of air present in the bladder. This could be from is is   mentation or infection.   5. Prior cholecystectomy. Left hip replacement.    Verified By: Osa Craver, M.D., MD    14-Apr-14 11:28, CT Chest Without Contrast  CT Chest Without Contrast  REASON FOR EXAM:    possible cavitation  COMMENTS:       PROCEDURE: CT  - CT CHEST WITHOUT CONTRAST  - Dec 25 2012 11:28AM     RESULT: Comparison: Chest radiograph performed same day    Technique: Multiple axial images of the chest were obtained without   intravenous contrast.    Findings:  There are a few borderline enlarged mediastinal lymph nodes. No definite   hilar lymphadenopathy, though this evaluation is limited by the adjacent   atelectatic lung. No hilar lymphadenopathy. Calcifications are seen in    the coronary arteries. Surgical clips are seen from prior     cholecystectomy. There is mild stranding in the mesenteric fat at the   upper abdomen, which is nonspecific.    There are moderate sized bilateral pleural effusions. Consolidation and   volume loss of the lower lobes is likely secondary to atelectasis. Mild   opacities in the posterior right and left upper lobes are also likely   secondary to atelectasis.    No aggressive lytic or sclerotic osseous lesions are identified.    IMPRESSION:   1. Moderate sized bilateral pleural effusions. Consolidation and volume   loss of the lower lobes is likely secondary to atelectasis from the   pleural effusions. Mild opacities in the posterior upper lobes likely   secondary to atelectasis. No area of cavitation seen. Followup     radiography is recommended.  2. There are a few borderline enlarged mediastinal lymph nodes, which are   nonspecific.        Verified By: Gregor Hams, M.D., MD   ASSESSMENT AND PLAN:  Assessment/Admission Diagnosis CKD, now with renal failure.   Hyperkalemia better Volume overloaded and not improving Cr still over 5   Plan Will need HD.  Will place temporary catheter in hopes renal function will return.  If not, will need Permcath in the future.    Level 4   Electronic Signatures: Algernon Huxley (MD)  (Signed 17-Apr-14 15:57)  Authored: Chief Complaint and History, ALLERGIES, HOME MEDICATIONS, Family and Social History, Review of Systems, Physical Exam, LABS, RADIOLOGY, Assessment and Plan   Last Updated: 17-Apr-14 15:57 by Algernon Huxley (MD)

## 2015-01-03 NOTE — Consult Note (Signed)
Impression:     81yo male w/ h/o DM, recent hip fracture, s/p hemiarthroplasty and CRI admitted with bilateral pleural effusion, worsening renal failure and C. diff colitis.     His CXR showed a possible infiltrate, but his CT chest showed pleural effusions with compressive atelectasis.  He does NOT need therapy for healthcare associated pneumonia.    He is having multiple loose stools per day and his C. diff PCR is positive.  Would continue metronidazole.    Given his worsening renal function, he is getting increased IVF.  This will likely worsen his pleural fluid collection.  He may require therapeutic pleurocentesis if his oxygenation worsens.    If his renal function worsens, he may end up on HD.      Echo showed mild EF decrease, but Would consider echo to look for CHF does not appear to be the etiology for his edema..    Will stop his antibiotics other than metronidazole.   Electronic Signatures: Camila Norville MPH, Heinz Knuckles (MD) (Signed on 16-Apr-14 17:18)  Authored   Last Updated: 16-Apr-14 17:28 by Jashanti Clinkscale MPH, Heinz Knuckles (MD)

## 2015-01-03 NOTE — Consult Note (Signed)
Brief Consult Note: Diagnosis: 1. Pre-operative evaluation 2. DM 3. HTN 4. GERD 5. CKD Stage III 6. Leukocytosis 7. Osteoarthritis.   Patient was seen by consultant.   Consult note dictated.   Recommend to proceed with surgery or procedure.   Orders entered.   Comments: 81 yo male w/ hx of DM, HTN, GERD, CKD Stage III, OA, came into hospital w/ fall and noted to have left hip fracture.   1. Pre-operative evaluation - pt. is likely moderate risk for non-cardiac surgery. NO absolute contraindications to surgery at this time.  - cont. peri-operative Bblocker.  ECG reviewed and no acute changes from previous.   2. HTN - cont. Enalapril, Toprol, hydralazine, Norvasc 3. DM - cont. Glipzide and low dose Lantus.  4. GERD - cont. Omeprazole 5. hx of CKD Stage III -  Cr. at baseline. Follows w/ Dr. Holley Raring. No acute issue.   Full Code Thanks for the consult and will follow with you.  Job # [id number].  Electronic Signatures: Henreitta Leber (MD)  (Signed 07-Mar-14 16:24)  Authored: Brief Consult Note   Last Updated: 07-Mar-14 16:24 by Henreitta Leber (MD)
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
## 2015-01-03 NOTE — Consult Note (Signed)
General Aspect 81 year old gentleman with past medical history significant for hypertension, hyperlipidemia and diabetes mellitus, recent hospitalization twice in March 2014 for a fall on ice resulting in left hip fracture status post surgery and brief hospitalization for viral gastroenteritis, initially presenting with SOB and worsening pedal edema.   On arrival, legs were  edematous,cough was more productive. His labs show acute on chronic renal failure with hyperkalemia and his chest x-ray shows significant right lower lobe infiltrate. He was hypoxic,  his sats were 90% on room air. He was started on HD. potassium 6.5 on arrival.  On tele over the past few days, he has had rare epsiodes of second degree AV block type II, very frequent NSVT triplets, some NSVT runs up to 12 to 15 beats, two episodes this AM. He is hard of hearing, reports having a long hx of palpiations, difficulty obtaining blood pressures secondary to arrhythmia. No symptoms per the patient.   Present Illness . SOCIAL HISTORY: Used to live at home by himself, has a son and daughter, but for the last month since his left hip fracture and surgery, he has been a resident at [facility] receiving rehab. No history of any smoking, alcohol or drug use.   FAMILY HISTORY: Both parents were healthy and lived into their 35s. No significant family medical problems.   Physical Exam:  GEN well developed, well nourished, no acute distress, obese   HEENT hearing intact to voice, moist oral mucosa, poor hearing   NECK supple   RESP normal resp effort  rhonchi  crackles   CARD Irregular rate and rhythm  Murmur   Murmur Systolic   ABD denies tenderness  soft   LYMPH negative neck   EXTR positive edema, of arms and legs   SKIN normal to palpation   NEURO motor/sensory function intact   PSYCH alert     Hyperlipidemia:    Hypertension:    GERD:    Diabetes:    Urethral Removal:        Admit Diagnosis:    HYPERKALEMIA PNEUMONIA: Onset Date: 30-Dec-2012, Status: Active, Description: HYPERKALEMIA PNEUMONIA  Home Medications: Medication Instructions Status  metoprolol succinate 50 mg oral tablet, extended release 1 tab(s) orally once a day Active  hydrALAZINE 50 mg oral tablet 1 tab(s) orally 4 times a day Active  acetaminophen 325 mg oral tablet 2 tab(s) orally every 4 hours, As needed, temperature >100.5 or pain Active  magnesium hydroxide 8% oral suspension 30 milliliter(s) orally once a day (at bedtime), As needed, constipation Active  ferrous sulfate 325 mg (65 mg elemental iron) oral tablet 1 tab(s) orally 2 times a day (with meals) Active  calcium-vitamin D 500 mg-200 intl units oral tablet 1 tab(s) orally 2 times a day (with meals) Active  omeprazole 20 mg oral delayed release capsule 1 cap(s) orally once a day (in the evening) Active  vitamin E 400 intl units oral capsule 1 cap(s) orally 2 times a day Active  One Tab Daily Multiple Vitamins oral tablet 1 tab(s) orally once a day (in the morning) Active  amlodipine 10 mg oral tablet 1 tab(s) orally once a day (in the evening) Active  aspirin 81 mg oral tablet 1 tab(s) orally once a day (in the morning) Active  Cosamin DS 400 mg-500 mg oral tablet 1 tab(s) orally once a day (in the morning) Active  Vitamin D3 50,000 intl units oral capsule 1 cap(s) orally once a month Active  Norco 325 mg-5 mg oral tablet  1-2 tab(s) orally every 4 hours, As Needed - for Pain Active  Zofran 8 mg oral tablet 1 tab(s) orally 3 times a day, As Needed - for Nausea Active  furosemide 40 mg oral tablet 1 tab(s) orally once a day Active  Lantus Solostar Pen 100 units/mL subcutaneous solution 50 unit(s) subcutaneous once a day Active  NovoLOG FlexPen 100 units/mL subcutaneous solution  subcutaneous sliding scale Active   Lab Results:  Routine Chem:  19-Apr-14 04:28   Glucose, Serum  149  BUN  61  Creatinine (comp)  3.95  Sodium, Serum 141  Potassium, Serum  4.6  Chloride, Serum  108  CO2, Serum 28  Calcium (Total), Serum 8.5  Anion Gap  5  Osmolality (calc) 301  eGFR (African American)  15  eGFR (Non-African American)  13 (eGFR values <32m/min/1.73 m2 may be an indication of chronic kidney disease (CKD). Calculated eGFR is useful in patients with stable renal function. The eGFR calculation will not be reliable in acutely ill patients when serum creatinine is changing rapidly. It is not useful in  patients on dialysis. The eGFR calculation may not be applicable to patients at the low and high extremes of body sizes, pregnant women, and vegetarians.)  Result Comment LABS - This specimen was collected through an   - indwelling catheter or arterial line.  - A minimum of 549m of blood was wasted prior    - to collecting the sample.  Interpret  - results with caution.  Result(s) reported on 30 Dec 2012 at 04:41AM.  Routine Hem:  19-Apr-14 04:28   Platelet Count (CBC) 186   EKG:  Interpretation EKG on arrival shows sinus bradycardia, rate in teh low 50s. tele showing NST, frequent triplets   Radiology Results: XRay:    16-Apr-14 07:56, Chest PA and Lateral  Chest PA and Lateral   REASON FOR EXAM:    follow up pneumonia  COMMENTS:       PROCEDURE: DXR - DXR CHEST PA (OR AP) AND LATERAL  - Dec 27 2012  7:56AM     RESULT:     Comparison is made to a prior study dated 12/25/2012.    Findings:  There has been slight increased conspicuity of the right lower   lobe infiltrate. The area demonstrates a more confluent appearance.   Blunting of the right costophrenic angle is identified. No new focal   regions of consolidation identified. The cardiac silhouette is enlarged.   The patient has taken a shallow inspiration. There is prominence of the   interstitial markings. The visualized bony skeleton is unremarkable.  IMPRESSION:      1. Increased confluence of the right lower lobe infiltrate. Note,   radiographic findings may lagbehind  clinical findings. No new focal   regions of consolidation identified.   2. Interstitial infiltrate accentuated by the patient's shallow   inspiration though underlying component of pulmonary edema and/or   pulmonary fibrosis is of diagnostic consideration. Continued surveillance   evaluation is recommended.        Thank you for this opportunity to contribute to the care of your patient.     Verified By: HEMikki SanteeM.D., MD  Cardiology:    14-Apr-14 15:04, Echo Doppler  Echo Doppler   REASON FOR EXAM:      COMMENTS:       PROCEDURE: [place]PLETE(TRANSTHOR)  - Dec 25 2012  3:04PM     RESULT: Echocardiogram Report    Patient Name:  [name] [name] Date of Exam: 12/25/2012  Medical Rec #:  [id number]        Custom1:  Date of Birth:  Aug 21, 1926                  Height:       74.0 in  Patient Age:    21 years                   Weight:       230.0 lb  Patient Gender: M                          BSA:          2.31 m??    Indications: CHF  Sonographer:  Geraldine  Referring Phys: Gladstone Lighter    Summary:   1. Bradycardia with ectopy noted.   2. Left ventricular ejection fraction, by visual estimation, is 50 to   55%.   3. Mild left ventricular hypertrophy.   4. Normal right ventricularsize and systolic function.   5. Mildly dilated left atrium.   6. Normal RVSP.  2D AND M-MODE MEASUREMENTS (normal ranges within parentheses):  Left Ventricle:          Normal  IVSd (2D):      1.39 cm (0.7-1.1)  LVPWd (2D):     1.22 cm (0.7-1.1) Aorta/LA:                  Normal  LVIDd (2D):     4.98 cm (3.4-5.7) Aortic Root (2D): 3.10 cm (2.4-3.7)  LVIDs (2D):     3.49 cm           Left Atrium (2D): 4.90 cm (1.9-4.0)  LV FS (2D):     29.9 %   (>25%)  LV EF (2D):     56.9 %   (>50%)                       Right Ventricle:                                    RVd (2D):  LV DIASTOLIC FUNCTION:  MV Peak E: 1.17 m/s E/e' Ratio: 23.50  MV Peak A: 0.92 m/s Decel  Time: 290 msec  E/A Ratio: 1.27  SPECTRAL DOPPLER ANALYSIS (where applicable):  Mitral Valve:  MV P1/2 Time: 84.10 msec  MV Area, PHT: 2.62 cm??  Aortic Valve: AoV Max Vel: 1.36 m/s AoV Peak PG: 7.4 mmHg AoV Mean PG:  LVOT Vmax: 0.76 m/s LVOT VTI:  LVOT Diameter: 2.00 cm  AoV Area, Vmax: 1.74 cm?? AoV Area, VTI:  AoV Area, Vmn:  Tricuspid Valve and PA/RV Systolic Pressure: TR Max Velocity: 1.76 m/s RA   Pressure: 5 mmHg RVSP/PASP: 17.4 mmHg  Pulmonic Valve:  PV Max Velocity: 1.34 m/s PV Max PG: 7.2 mmHg PV Mean PG:    PHYSICIAN INTERPRETATION:  Left Ventricle: The left ventricular internal cavity size was normal. LV   posterior wall thickness was normal. Mild left ventricular hypertrophy.   Left ventricular ejection fraction, by visual estimation, is 50 to 55%.   Spectral Doppler shows normal pattern of LV diastolic filling.  Right Ventricle: Normal right ventricular size, wall thickness, and   systolic function. The right ventricular size is normal. Global RV   systolic function is normal.  Left Atrium: The left atrium is mildly dilated.  Right Atrium: The right atrium is normal in size.  Pericardium: There is no evidence of pericardial effusion.  Mitral Valve: Structurally normal mitral valve, with normal leaflet   excursion; without any evidence of mitral stenosis or significant   regurgitation. The mitral valve is normal in structure. Mild mitral valve   regurgitation is seen. MAC.  Tricuspid Valve: Structurally normal tricuspid valve, with normal leaflet   excursion. The tricuspid valve is normal. Mild tricuspid regurgitation is   visualized. The tricuspid regurgitant velocity is 1.76 m/s, and with an   assumed right atrial pressure of 5 mmHg, the estimated right ventricular   systolic pressure is normal at 17.4 mmHg.  Aortic Valve: The aortic valve is structurally normal, with no evidence   of sclerosis or stenosis. No evidence of aortic valve regurgitation is    seen.  Pulmonic Valve: Structurally normal pulmonic valve, with normal leaflet     excursion. The pulmonic valve is normal. Trace pulmonic valve   regurgitation.  Aorta: The aortic root is normal in size and structure.    [id number] Ida Rogue MD  Electronically signed by [id number] Ida Rogue MD  Signature Date/Time: 12/26/2012/8:14:20 AM    *** Final ***    IMPRESSION: .        Verified By: Minna Merritts, M.D., MD    catgut: Blisters  Vital Signs/Nurse's Notes: **Vital Signs.:   19-Apr-14 09:01  Vital Signs Type Routine  Temperature Temperature (F) 98.1  Celsius 36.7  Temperature Source oral  Pulse Pulse 58  Respirations Respirations 18  Systolic BP Systolic BP 193  Diastolic BP (mmHg) Diastolic BP (mmHg) 51  Mean BP 82  Pulse Ox % Pulse Ox % 94  Pulse Ox Activity Level  At rest  Oxygen Delivery 2L    Impression 81 year old gentleman with past medical history significant for hypertension, hyperlipidemia and diabetes mellitus, recent hospitalization twice in March 2014 for a fall on ice resulting in left hip fracture status post surgery and brief hospitalization for viral gastroenteritis, initially presenting with SOB and worsening pedal edema.   1) Arrhythmia Likely exacerbated by electrolyte abn, PNA, hypoxia Likely has sick sinus syndrome ECHO with EF 50% and LVH frequent PVCs, triplets some NSVT runs, idioventricular rhythm Some second degree AV block, type II --Unclear if this is contributing to acute on chronic heart failure sx --Will start amildarone 200 mg po q8 WIll monitor rhythm carefully If he develops worsening heart block, will hold amiodarone, use metoporlol po  2) Acute respiratory failure Likely multifactorial, diastolic CHF/renal failure and fluid overload, PNA ID following on broad abx Still with consolidation on CXR   3)C. diff colitis-  wbc normal On flagyl  4) ARF-  on HD, renal following continues to make urine. renal u/s  normal  5) * HTN-  restart home meds, agree hold metoprolol for now  6)  DM-  ssi, lantus   Electronic Signatures: Ida Rogue (MD)  (Signed 19-Apr-14 13:20)  Authored: General Aspect/Present Illness, History and Physical Exam, Past Medical History, Health Issues, Home Medications, Labs, EKG , Radiology, Allergies, Vital Signs/Nurse's Notes, Impression/Plan   Last Updated: 19-Apr-14 13:20 by Ida Rogue (MD)

## 2015-01-03 NOTE — Consult Note (Signed)
PATIENT NAME:  [name] [name], [name] [name] MR#:  [id number] DATE OF BIRTH:  1926-02-18  DATE OF CONSULTATION:  11/17/2012  REFERRING PHYSICIAN:  Earnestine Leys, MD CONSULTING PHYSICIAN:  Belia Heman. Verdell Carmine, MD  PRIMARY CARE PHYSICIAN: Viviana Simpler, MD  REASON FOR CONSULTATION: Medical management and preoperative medical evaluation.   HISTORY OF PRESENT ILLNESS: This is an 81 year old male who presents to the Emergency Room today secondary to a fall and noted to have a left hip fracture. The patient denied any prodromal symptoms prior to his fall like any dizziness, palpitations, chest pain, shortness of breath, nausea, vomiting or any other associated symptoms. The patient simply said he fell on the ice and his daughter-in-law brought him to the ER. He was noted to have a left-sided hip femoral neck fracture. Hospitalist services were contacted for preoperative medical evaluation and management.   REVIEW OF SYSTEMS:   CONSTITUTIONAL: No documented fever. No weight gain or weight loss.  EYES: No blurry or double vision.  ENT: No tinnitus. No postnasal drip. No redness of the oropharynx.  RESPIRATORY: No cough, no wheeze, no hemoptysis, no dyspnea.  CARDIOVASCULAR: No chest pain, no orthopnea, no palpitations or syncope.  GASTROINTESTINAL: No nausea. No vomiting. No diarrhea. No abdominal pain, no melena or hematochezia.  GENITOURINARY: No dysuria or hematuria.  ENDOCRINE: No polyuria or nocturia. No heat or cold intolerance.  HEMATOLOGIC: No anemia, no bruising, no bleeding.  INTEGUMENTARY: No rashes. No lesions.  MUSCULOSKELETAL: No arthritis, no swelling,  no gout.   NEUROLOGIC: No numbness or tingling. No ataxia. No seizure-type activity.  PSYCHIATRIC: No anxiety. No insomnia. No ADD.   PAST MEDICAL HISTORY: Consistent with diabetes, hypertension, osteoarthritis, chronic kidney disease, stage III, GERD.   ALLERGIES: No known drug allergies.   SOCIAL HISTORY: Used to be a smoker, quit 40+  years, still smokes pipe. Occasional alcohol use. No illicit drug abuse. Lives at home by himself.   FAMILY HISTORY: Both mother and father died from complications of old age.   CURRENT MEDICATIONS: Amlodipine 10 mg daily, aspirin 81 mg daily, Cosamin  400/500 one tab daily, enalapril 20 mg b.i.d., Lasix 40 mg daily, glipizide 5 mg daily in the morning, hydralazine 100 mg t.i.d., Lantus 50 to 70 units daily in the morning, metoprolol succinate 100 mg daily, omeprazole 20 mg daily, multivitamin daily. Vitamin D3 at 50,000 international units monthly and vitamin E 400 international units b.i.d.   PHYSICAL EXAMINATION: Currently is as follows:   VITAL SIGNS:  Are noted to be:  Temperature 97.3, pulse 72, respirations 20, blood pressure 150/70, sats 94%  on 2 liters nasal cannula.  GENERAL: He is a pleasant appearing male in no apparent distress.  HEENT: Atraumatic, normocephalic. His extraocular muscles are intact. Pupils equal and reactive to light. Sclerae anicteric. No conjunctival injection. No pharyngeal erythema.  NECK: Supple. No jugular venous distention. No bruits, no lymphadenopathy, no thyromegaly.  HEART: Regular irregular. No murmurs, no rubs, no clicks.  LUNGS: Clear to auscultation bilaterally. No rales or rhonchi. No wheezes.  ABDOMEN: Soft, flat, nontender, nondistended. Has good bowel sounds. No hepatosplenomegaly appreciated.  EXTREMITIES: No evidence of any cyanosis, clubbing, +2 pitting edema from the knees to the ankles bilaterally. His left lower extremity is shortened and externally rotated due to the hip fracture. +2 pedal and radial pulses bilaterally.  NEUROLOGICAL: The patient is alert, awake, and oriented x 3 with no focal motor or sensory deficits appreciated bilaterally.  SKIN: Moist and warm with no rash appreciated.  LYMPHATIC:  There is no cervical or axillary lymphadenopathy.  LABORATORY, DIAGNOSTIC AND RADIOLOGIC DATA: Shows a serum glucose 117, BUN 24, creatinine  1.7, sodium 143, potassium 4, chloride 113, bicarbonate 23. LFTs are within normal limits. White cell count 18, hemoglobin 12.3, hematocrit 37.5, platelet count 207. INR 0.9.   The patient did have x-ray of the left hip which showed displaced left femoral neck fracture and also a chest x-ray done showing no acute cardiopulmonary disease.   ASSESSMENT AND PLAN: This is an 81 year old male with a history of diabetes, hypertension, gastroesophageal reflux disease, chronic kidney disease stage III, osteoarthritis who presents to the hospital with a fall and noted to have an acute left femoral neck fracture.  1.  Preoperative medical evaluation. The patient is likely a moderate risk for noncardiac surgery. There is no absolute contraindications to surgery at this time. I would continue his perioperative beta blocker with Toprol. His EKG has been reviewed which showed normal sinus rhythm with left axis deviation and left ventricular hypertrophy by voltage criteria, although is unchanged from his previous EKG as compared to one in April of last year.  2.  Hypertension. The patient is presently hemodynamically stable. I will continue his enalapril, Toprol, hydralazine and Norvasc.  3.  Diabetes. I will continue his glipizide and low dose Lantus for now. 4.  Gastroesophageal reflux disease. Continue omeprazole.  5.  History of chronic kidney disease, stage III. The patient's creatinine is currently at baseline. The patient follows with Dr. Holley Raring as an outpatient. No acute issue related to this at.  6.  Leukocytosis. I presume this is probably related to stress mediation from the recent fracture. I do not appreciate any evidence of acute infectious source. His chest x-ray is negative. I will await a urinalysis and follow that up. I will follow his white cell count.   Thank you so much for the consultation. We will follow along with you.   TIME SPENT: A 50 minutes      ____________________________ Belia Heman.  Verdell Carmine, MD vjs:cc D: 11/17/2012 16:24:27 ET T: 11/17/2012 19:03:09 ET JOB#: [id number]  cc: Belia Heman. Verdell Carmine, MD, <Dictator> Henreitta Leber MD ELECTRONICALLY SIGNED 11/22/2012 8:16

## 2015-01-03 NOTE — H&P (Signed)
PATIENT NAME:  [name] [name], [name] [name] MR#:  [id number] DATE OF BIRTH:  1926-09-09  DATE OF ADMISSION:  12/04/2012  PRIMARY CARE PHYSICIAN: Viviana Simpler, MD  CHIEF COMPLAINT: Nausea, vomiting and abdominal pain.   HISTORY OF PRESENT ILLNESS: Mr. Power is an 81 year old pleasant white male with past medical history of hypertension, hyperlipidemia, diabetes mellitus, and morbid obesity who recently had a left hip fracture, underwent hemiarthroplasty, and was discharged to a nursing home. The patient has been undergoing rehab. Developed nausea and vomiting about a week back. Was brought to the Emergency Department at that time.  As the patient's symptoms improved, the patient was discharged back to the nursing home. Per the patient's daughter, the patient had one episode of vomiting yesterday afternoon.  Went to bed. Woke him up from sleep around 2:30 in the morning with the symptoms of nausea, vomiting and abdominal pain. Had multiple episodes of vomiting, brownish color. The patient states at the pain is in the lower part of the abdomen, dull aching pain, 10 out of 10 in intensity. The patient states he had 1  bowel movement yesterday, however could not specify. Denied having any blood in the stool. Denied having any blood in the vomitus. After the vomiting, the patient's symptoms somewhat improved. Denied having any fever at home. Unsure about if he had any sick contacts around him. Denied using any recent new medication.  The patient states he has been having good appetite until yesterday. Tolerated yesterday evening's supper as well. Workup in the Emergency Department with CT of abdomen and pelvis is consistent with mild colitis. Stated that fluid is  present in the colon. This could be from the diarrheal illness.  Lab work is unremarkable, except for renal insufficiency which seems to be chronic. Denied having any chest pain, palpitations, nausea, vomiting.   PAST MEDICAL HISTORY: 1.  Hypertension.  2.   Hyperlipidemia.  3.  Gastroesophageal reflux disease.  4.  Diabetes mellitus, insulin-dependent.  5.  Urological intervention.  6.  Transitional cell carcinoma of the bladder.    PAST SURGICAL HISTORY:  1.  Appendectomy.  2.  Clavicle surgery on the left side.   ALLERGIES: CAT GUT.  HOME MEDICATIONS:  1.  Zofran 8 mg 1 tablet every 8 hours.  2.  Vitamin E 400 units 1 tablet 2 times a day.  3.  Vitamin D3 50,000 units once a month.  4.  Phenergan 25 mg injectable as needed. 5.  Multivitamin once a day.  6.  Omeprazole 20 mg once a day.  7.  Norco 5/325 mg 2 tablets every 4 hours as needed.  8.  Toprol-XL 100 mg once a day.  9.  Magnesium hydroxide 30 mL once a day.  10.  Lovenox 40 mg once a day.  11.  Lasix 40 mg once a day.  12.  Lantus 50 units once a day.  13.  NovoLog subcutaneous 4 times a day.  14.  Hydralazine 100 mg 3 times a day.  15.  Ferrous sulfate 325 mg 2 times a day.  16.  Enalapril 20 mg 2 times a day.  17.  Glucosamine 400 mg 1 tablet once a day.  18.  Calcium with vitamin D 1 tablet 2 times a day.  19.  Aspirin 81 mg daily.  20.  Amlodipine 10 mg daily.  21.  Acetaminophen 1 to 2 tablets as needed.   SOCIAL HISTORY: No history of smoking, drinking alcohol or using illicit drugs. Prior to his hip  fracture, the patient was living by himself, was independent of ADL and IADLs at that time. The patient still has been poor functional status at the nursing home.   FAMILY HISTORY: Both parents lived to their 16s, died of old age.   REVIEW OF SYSTEMS: CONSTITUTIONAL: Denied having any weakness, fatigue.  EYES: No change in vision. No drainage from the eyes.  ENT: No sore throat. Has some decreased hearing.  RESPIRATORY:  No cough or shortness of breath.  CARDIOVASCULAR: Denies having any chest pain, palpitations. Has pedal edema.  GASTROENTEROLOGY: Nausea, vomiting and abdominal pain.  GENITOURINARY: No dysuria or hematochezia.  ENDOCRINE: No polyphagia or  polydipsia.  HEMATOLOGIC: No easy bruising or bleeding.  MUSCULOSKELETAL: Has joint pain in multiple joints.  NEUROLOGIC:  No weakness or numbness.  PSYCH:  Denies having any depression.   PHYSICAL EXAMINATION: GENERAL: This is a well-built, well-nourished age-appropriate male lying down in the bed, not in distress.  VITAL SIGNS: Temperature 98.3, pulse 59, blood pressure 162/65, respiratory rate 18 and oxygen saturation 95% on room air.  HEENT: Head normocephalic, atraumatic. Eyes no scleral icterus. Conjunctivae normal. Pupils equal and react to light. Mucous membranes moist.  NECK: Supple. No lymphadenopathy. No JVD. No carotid bruit.  CHEST: Has no focal tenderness.  LUNGS: Bilaterally clear to auscultation.  HEART: S1 and S2 regular. No murmurs are heard.  ABDOMEN: Somewhat diminished bowel sounds in the right upper and lower quadrants as well as left lower quadrant. Mild tenderness in the right lower quadrant and some in epigastric area. No rebound or guarding. EXTREMITIES:  2+ pitting edema extending up to the knees.  Seems to be more chronic in nature. NEUROLOGIC:  The patient is alert and oriented to place, person and time. Cranial nerves II through XII intact. No motor or sensory deficits.   LABORATORY AND DIAGNOSTICS:  Troponins are negative. Magnesium 1.9.  Lipase 58. BNP 7000. CMP: BUN 34 and creatinine 1.86. CBC: WBC 9.6, hemoglobin 9 and platelet count 396. UA negative for nitrites and leukocyte esterase.   CT of abdomen and pelvis done from 21st March 2014 shows atelectasis versus infiltrate in both lung bases with a small pleural effusion. Fluid present in the colon. This could be from diarrheal illness.  Stable hydroureter on the right.  Small moderate amount of air present in the bladder. Cholecystectomy and left hip replacement.   ASSESSMENT AND PLAN: [name] [name] is an 81 year old male who comes to the Emergency Department with complaints of nausea, vomiting and abdominal  pain.  1.  Nausea and vomiting.  Possibly this is a viral infectious process, however, we also cannot exclude an obstructive process as well, especially with the patient vomiting more brownish material.  However, CAT scan showed somewhat colitis or fluid in the colon. The patient has not been having any diarrhea. Will admit the patient to a medical bed, keep the patient n.p.o., continue with IV fluids, keep him on Cipro and Flagyl. If the patient's symptoms are worse, consider getting CT of abdomen and pelvic with oral contrast.  2.  Hypertension, moderately controlled.  The patient is on multiple medications. Will tart back on all the medication.  3.  Diabetes mellitus, insulin-dependent.  Will give half of home dose of the Lantus as the patient is currently n.p.o. Will also keep the patient on Accu-Cheks every 6 hours with sliding scale coverage.  4.  Recent left hip fracture. We will continue physical therapy, occupational therapy. The patient is supposed to follow up with  Dr. Sabra Heck today. However, secondary to abdominal pain, the patient is admitted to the hospital. Will consider consulting Dr. Sabra Heck for staple removal if appropriate.  5.  Debility. We will continue with physical therapy, occupational therapy.  6.  Protein malnutrition. The patient's albumin is 2.7. Will consider getting nutritionist consult.  7.  Chronic renal insufficiency. Slightly worse compared to the previous admission. Will continue with IV fluids and follow up.  8.  Anemia, most likely from the chronic renal insufficiency. No significant drop from the patient's baseline. Will continue to follow up.  9.  Obesity. Will need further counseling.  The patient has less activity and reconsidering high calorie diet.  10.  The patient has been on deep vein thrombosis prophylaxis for left hip fracture. Will continue with that.  ____________________________ Monica Becton, MD pv:sb D: 12/04/2012 06:50:22 ET T: 12/04/2012  07:21:30 ET JOB#: [id number]  cc: Monica Becton, MD, <Dictator> Grier Mitts Renel Ende MD ELECTRONICALLY SIGNED 12/05/2012 1:32

## 2015-01-03 NOTE — Consult Note (Signed)
PATIENT NAME:  [name] [name], [name] [name] MR#:  [id number] DATE OF BIRTH:  May 27, 1926  DATE OF CONSULTATION:  01/01/2013  CONSULTING PHYSICIAN:  Gerrit Heck. Lorrayne Ismael, DPM  HISTORY OF PRESENT ILLNESS: The patient is an 81 year old male admitted to the hospital for some GI issues, also has had a recent hip fracture and has had ulcers develop on his right heel, and he has a wound on his left heel and also on his plantar left foot as well. He has been getting some cursory treatment to the heel, but he also developed a wound on the left great toe distally as well which is problematic for him. 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   PAST MEDICAL HISTORY: 1. Hypertension.  2. Hyperlipidemia.  3. Insulin-dependent diabetes.  4. Gastroesophageal reflux.  5. Transitional carcinoma of the bladder. 6. Left hip fracture surgery, as noted.  7. Chronic kidney disease, stage 3.  8. Left heel pressure sore.   PAST SURGICAL HISTORY: Includes: 1. Cataract surgery.  2. Also the left hip fracture. 3. Left shoulder surgery. 4. Appendectomy.   CURRENT MEDICATIONS: Include Norvasc 10 mg daily, calcium and vitamin D supplement, hydralazine 50 mg 4 times a day, metoprolol 50 mg daily, Norco 5/325 q. 4 hours p.r.n. pain, Zofran 8 mg t.i.d. for nausea, Lasix 40 mg daily, Lantus 50 units subcutaneous in the morning, sliding scale insulin, aspirin 81 mg daily, ferrous sulfate supplement, omeprazole 20 mg daily, vitamin D and vitamin E supplement and also Tylenol for pain and fever control.   SOCIAL HISTORY: He has been living at home by himself, but most recently since his hip fracture has been at [facility] and was brought here from [facility]. He is an alert, fairly well oriented 81 year old Caucasian male who is with several of his daughters at this point, and they are helpful in answering questions.  PHYSICAL EXAMINATION:  VITAL SIGNS: Stable. Temperature is 97.9, pulse 71,  respirations 18, blood pressure is 152/50, and pulse ox the last time it was checked was 90%.  LOWER EXTREMITY EXAM: Vascular and DP pulses are hard to palpate at this point. He does have some noted edema and fluid to his legs, ankles and feet. Dermatologically, the patient has an ulcerative lesion on the posterior left heel that is approximately 3 cm x 2 cm.  He appears to have a little bit of superficial build-up on the heel that is consistent with the superficial eschar, but the wound looks like it is probably a full-thickness wound  down through the skin into subcutaneous tissue. He does have an ulcer also, submet 2 on the left foot.  This is also full thickness cuff through the skin but no real cellulitis to either one of these ulcers. He has a wound on the distal tip of the left hallux which appears to be likely full thickness damage as well but does not have an open area to it at this juncture. There is no cellulitis or tracking to any of these regions. No cellulitis to the foot at this juncture.   CLINICAL IMPRESSION: Three wounds, left foot, as noted.   TREATMENT PLAN: I am going to debride him today and remove superficial portions of the submet 2 wound and the left hallux wound.  I will get him started with wet-to-dry saline dressings to the region, heavy padding.  I want to use a little Santyl ointment on the left heel and the left great toe  in order to try to soften that eschar that is on those regions to make it a little easier to debride and see if it will start to stimulate some granulation tissue to these areas.  I am going to order some boots for him to utilize in order to make sure he does not get any new ulcers or new sores and protect the ones that he already has.  I want him to be nonweightbearing right now. When he gets to the point where he can potentially bear weight, then we will move him into some OrthoWedge shoes on his left foot. I will continue to follow him.  I also would like to  get Vascular to take a look at him, and they have seen him before, and see what they think about his overall circulation to his feet at this juncture.    ____________________________ Gerrit Heck Lanora Reveron, DPM mgt:cb D: 01/01/2013 17:29:18 ET T: 01/01/2013 20:51:25 ET JOB#: [id number]  cc: Gerrit Heck Geraldo Haris, DPM, <Dictator> Perry Mount MD ELECTRONICALLY SIGNED 01/23/2013 17:57

## 2015-01-03 NOTE — Op Note (Signed)
PATIENT NAME:  [name] [name], [name] [name] MR#:  [id number] DATE OF BIRTH:  13-Sep-1926  DATE OF PROCEDURE:  11/18/2012  PREOPERATIVE DIAGNOSIS: Displaced subcapital fracture of the left hip.   POSTOPERATIVE DIAGNOSIS: Displaced subcapital fracture of the left hip.   PROCEDURE PERFORMED: Left hip hemiarthroplasty (Stryker Accolade prosthesis - 5 stem,  52 mm head, unipolar head, +4 mm neck length).   SURGEON: Park Breed, M.D.   ANESTHESIA: Spinal.   COMPLICATIONS: None.   DRAINS: Two Hemovacs  ESTIMATED BLOOD LOSS:  300 mL, replacement: None.   DESCRIPTION OF PROCEDURE: The patient was brought to the operating room where he underwent satisfactory spinal anesthesia. He was sedated as well. He was turned to the right lateral decubitus position and padded appropriately on the beanbag. The left hip was prepped and draped in sterile fashion and a posterolateral incision made. Dissection was carried out sharply through subcutaneous tissue. The fascia was divided and Charnley retractor inserted. The short external rotators were exposed and cut at their insertion and tagged. The posterior capsule was exposed and retractors inserted. The posterior capsule was incised in a T fashion and tagged. The leg was internally rotated and femoral neck exposed. Soft tissue debridement was carried out and the neck was then cut with the oscillating saw at the appropriate angle. The femoral head was then removed and measured to 57mm to 52 mm in size. The fovea was cleared of soft tissue. The acetabulum was thoroughly irrigated. A centering device was used establish the femoral canal. Accolade rasp was then inserted sequentially up to a #5 which fit quite snugly and well.   Trial reduction was then carried out using a 52 mm unipolar head with a +4 neck length. This reduced well and the leg lengths appeared to be excellent. The hip was stable. The trials were removed and the wound thoroughly irrigated.  A #5 Accolade  femoral stem was inserted and fit quite snugly. Another trial reduction was carried out and after the neck length was confirmed, a 52 mm head with a +4 neck length was  inserted and the hip reduced nicely. The hip was thoroughly irrigated. The capsule was closed with #2 Tycron. The short external rotators were repaired with the same suture. The deep fascia was closed with #2 Quill over a Hemovac drain and the subcutaneous tissue was closed with #0 Quill over another Hemovac. Irrigation was carried out at each level. The skin was closed with staples. A dry, sterile compression dressing applied. The Hemovac was activated. The patient was transferred to his hospital bed and taken to recovery in good condition. Leg lengths were excellent. The hip was stable.    ____________________________ Park Breed, MD hem:ct D: 11/18/2012 11:28:03 ET T: 11/18/2012 15:20:36 ET JOB#: [id number]  cc: Park Breed, MD, <Dictator> Park Breed MD ELECTRONICALLY SIGNED 11/19/2012 13:12

## 2015-01-03 NOTE — Op Note (Signed)
PATIENT NAME:  [name] [name], [name] [name] MR#:  [id number] DATE OF BIRTH:  28-Dec-1925  DATE OF PROCEDURE:  12/28/2012  DATE OF PROCEDURE: 12/28/2012.   PREOPERATIVE DIAGNOSES:  Chronic kidney disease with acute renal failure and volume overload and need for immediate dialysis.   POSTOPERATIVE DIAGNOSIS:   Chronic kidney disease with acute renal failure and volume overload and need for immediate dialysis.  PROCEDURE PERFORMED:   1.  Ultrasound guidance for vascular access, right femoral vein.  2.  Placement of right femoral Trialysis-type dialysis catheter, 30 cm in length.   SURGEON: Algernon Huxley, M.D.   ANESTHESIA: Local.   ESTIMATED BLOOD LOSS: Minimal.   INDICATION FOR PROCEDURE: This is an 81 year old gentleman with chronic kidney disease. He has had acute injury or acute renal insufficiency issues, creatinine is now greater than 5. He is volume overload and needs dialysis today. We are asked to place a dialysis catheter. Risks, benefits were discussed. Informed consent was obtained.   DESCRIPTION OF PROCEDURE: The patient is laid flat in his floor bed. His right groin was sterilely prepped and draped and a sterile surgical field was created. The femoral vein was visualized with ultrasound and found to be widely patent. It was then accessed under direct ultrasound guidance without difficulty with a Seldinger needle. A J-wire was placed. After skin nick and dilatation, the 30 cm long Trialysis-type dialysis catheter was placed over the wire and the wire was removed. It was secured at the skin at approximately 29 cm.  All 3 lumens withdrew blood well and flushed easily with sterile saline and sterile dressing was placed.    ____________________________ Algernon Huxley, MD jsd:ct D: 12/28/2012 16:57:19 ET T: 12/28/2012 18:11:16 ET JOB#: [id number]  cc: Algernon Huxley, MD, <Dictator> Algernon Huxley MD ELECTRONICALLY SIGNED 01/01/2013 14:00

## 2015-01-03 NOTE — H&P (Signed)
PATIENT NAME:  [name] [name], [name] [name] MR#:  [id number] DATE OF BIRTH:  [birth year]  DATE OF ADMISSION:  12/25/2012  ADMITTING PHYSICIAN: Gladstone Lighter, MD  PRIMARY CARE PHYSICIAN: Venia Carbon, MD from [facility].   CHIEF COMPLAINT: Difficulty breathing.   HISTORY OF PRESENT ILLNESS: The patient is an 81 year old gentleman with past medical history significant for hypertension, hyperlipidemia and diabetes mellitus, recent hospitalization twice in March. The first one about a month ago for a fall on ice resulting in left hip fracture status post surgery and was discharged to [facility]. He had a brief hospitalization for a day for viral gastroenteritis about 2-1/2 weeks ago. Today, he was sent in from [facility] because he was noted to be more tachypneic, easily exertional and also worsening pedal edema. The patient is a very poor historian and he states he feels fine and at his baseline and wants to go back. His legs are noticeably 4+ edematous at this time and he states this edema is new. He also noticed to have a cough which is more productive, but he states he has chronic cough. Denies any fevers or chills. His labs show acute on chronic renal failure with hyperkalemia and his chest x-ray shows significant right lower lobe infiltrate. He is also hypoxic, especially with easy exertion, though his sats are barely 90% on room air at rest at this time. He is being admitted for healthcare acquired pneumonia and acute renal failure.   PAST MEDICAL HISTORY:  1. Hypertension.  2. Hyperlipidemia.  3. Insulin-dependent diabetes mellitus.  4. Gastroesophageal reflux disease.  5. Transitional carcinoma of the bladder.  6. Left heel pressure ulcer.  7. Recent left hip fracture surgery.  8. Chronic kidney disease stage 3, baseline creatinine around 1.7.   PAST SURGICAL HISTORY:  1. Recent left hip fracture surgery.  2. Cataract surgery.  3. Left shoulder surgery.  4. Appendectomy.    ALLERGIES TO MEDICATIONS: CATGUT.  CURRENT MEDICATIONS: At the rehab include: 1. Norvasc 10 mg p.o. daily.  2. Calcium/vitamin D 500 mg/400 International Units p.o. daily.  3. Hydralazine 50 mg p.o. 4 times a day.  4. Metoprolol succinate 50 mg daily.  5. Norco 5/325 mg q.4 hours p.r.n. for pain.  6. Zofran 8 mg 3 times a day as needed for nausea.  7. Lasix 40 mg p.o. daily.  8. Lantus 50 units subcu in the morning.  9. Sliding scale insulin.  10. Aspirin 81 mg p.o. daily.  11. Ferrous sulfate 325 mg p.o. b.i.d.  12. Milk of magnesia p.r.n. for constipation daily.  13. Multivitamin 1 tablet p.o. daily.  14. Omeprazole 20 mg p.o. daily.  15. Tylenol 650 mg q.4 hours p.r.n. for pain or fever.  16. Vitamin D3 capsule 50,000 International Units monthly.  17. Vitamin E capsule 400 International Units p.o. b.i.d. for supplement.   SOCIAL HISTORY: Used to live at home by himself, has a son and daughter, but for the last month since his left hip fracture and surgery, he has been a resident at [facility] receiving rehab. No history of any smoking, alcohol or drug use.   FAMILY HISTORY: Both parents were healthy and lived into their 21s. No significant family medical problems.  REVIEW OF SYSTEMS: CONSTITUTIONAL: Denies any fevers or chills. Appears fatigued and weak.  EYES: Status post cataract surgery. No blurry vision double vision or glaucoma.  ENT: No tinnitus or ear pain. Positive for hearing loss. No dysphagia or postnasal drip.  RESPIRATORY: Positive for  cough. No wheezing or hemoptysis. Positive for tachypnea and dyspnea.  CARDIOVASCULAR: No chest pain or orthopnea. Positive for pedal edema and also dyspnea on exertion.  GASTROINTESTINAL: No nausea, vomiting, diarrhea, abdominal pain, hematemesis or melena.  GENITOURINARY: No dysuria, hematuria, renal calculus, frequency or incontinence.  ENDOCRINE: No polyuria, nocturia, thyroid problems, heat or cold intolerance.   HEMATOLOGY: No anemia, easy bruising or bleeding.  SKIN: No acne, rash or lesions.  MUSCULOSKELETAL: Positive for left hip pain. No arthritis or gout.  NEUROLOGICALLY: No numbness, weakness, CVA, TIA or seizures.  PSYCHOLOGICALLY: No anxiety, insomnia or depression.   PHYSICAL EXAMINATION:  VITAL SIGNS: Temperature 97.2 degrees Fahrenheit, pulse 55, respirations 20 but now breathing and using abdominal muscles for breathing, sats are 93% on room air at rest, but on exertion dropping down to high 80s, his blood pressure is 110/60.  GENERAL: Heavily built, well nourished male lying in bed, mild respiratory distress with mouth breathing.  HEENT: Normocephalic, atraumatic. Pupils equal, round, reacting to light. Anicteric sclerae. Extraocular movements intact. Oropharynx clear without erythema, mass or exudates.  NECK: Supple. No thyromegaly, JVD or carotid bruits. No lymphadenopathy.  LUNGS: He is moving air bilaterally. Decreased right basilar breath sounds with some rhonchi. No crackles. Minimal use of accessory muscles on exertion. No wheezing heard.  CARDIOVASCULAR: S1, S2. Regular rate and rhythm, III/VI systolic murmur. No rubs or gallops.  ABDOMEN: Obese, soft, nontender, nondistended. No hepatosplenomegaly. Normal bowel sounds.  EXTREMITIES: Has 4+ pedal edema with a left heel pressure ulcer wrapped currently. Unable to palpate dorsalis pedis pulses. No clubbing or cyanosis.  LYMPHATICS: No cervical lymphadenopathy.  NEUROLOGICALLY: Cranial nerves intact. No focal motor or sensory deficits.  PSYCHOLOGICALLY: The patient is sleepy, easily arousable, alert and oriented.   LABORATORY DATA:  WBC 7.9, hemoglobin 9.8, hematocrit 29.4, platelet count 273.  Sodium 137, potassium 6.9, chloride 109, bicarb 22, BUN , creatinine 4.66, glucose 169 and calcium of 8.5.  ALT 16, AST 11, alkaline phosphatase 57, total bili 0.2, albumin 3.6, troponin 0.66 first set. Chest x-ray showing right-sided  pneumonia, possibly asymmetric edema versus developing cavitary process. CT chest is suggested. Urinalysis is pending at this time. BNP is elevated at 25,000. CT chest showing moderate sized bilateral pleural effusions, consolidation and volume loss of the lower lobes with atelectasis, mild opacities are seen, no cavitation present. Borderline enlarged mediastinal lymph nodes are present.  EKG showing sinus bradycardia, left bundle branch block and first-degree AV block.   ASSESSMENT AND PLAN: An 81 year old male with a past medical history significant for hypertension, diabetes, chronic kidney disease stage 3 with recent left hip fracture about a month ago surgery status post surgery and at the rehab place, comes with worsening pedal edema and tachypnea.   1. Acute respiratory failure with tachypnea even on minimal exertion. Chest x-ray shows right lower lobe infiltrate. Because of recent hospitalization, will treat as healthcare acquired pneumonia. However, cannot rule out congestive heart failure with pedal edema and possible pleural effusions as noted on the CT chest. Though he is being started on vanc, Zosyn and Levaquin empirically while pending cultures, will also diurese him with IV Lasix and get an echocardiogram.  2. Acute renal failure, known CKD with baseline creatinine of 1.7 this year, now creatinine worsened to 4.6. Appears fluid overloaded. Nephrology consult. Will do strict Is and Os. Lasix 40 mg IV push stat and b.i.d. and get a renal ultrasound.  3. Hyperkalemia, likely from acute renal failure. The patient is being given Lasix  and also being treated for high potassium with insulin, dextrose and Kayexalate. Repeat another basic metabolic panel in 6 hours and monitor him on telemetry. Also Nephrology consulted.  4. Hypertension. Restart home medications except metoprolol since he is bradycardic at this time.  5. Diabetes mellitus. Continue Lantus and sliding scale insulin.  6. Bilateral  pedal edema. As mentioned above, might have underlying congestive heart failure, could be diastolic dysfunction; however, we will get an echocardiogram and Lasix b.i.d. for now. 7. GI and DVT prophylaxis, on Protonix and subcu heparin.  8. Left hip fracture, status post surgery about a month ago. Continue physical therapy as tolerated and discharge to rehab whenever medically stable.  9. Code status of the patient: Full code.  Critical care time spent on admission is 60 minutes.      ____________________________ Gladstone Lighter, MD rk:es D: 12/25/2012 95:63:87 ET T: 12/25/2012 13:00:17 ET JOB#: [id number]  cc: Gladstone Lighter, MD, <Dictator> Venia Carbon, MD Gladstone Lighter MD ELECTRONICALLY SIGNED 12/29/2012 15:55

## 2015-01-03 NOTE — Discharge Summary (Signed)
PATIENT NAME:  [name] [name], [name] [name] MR#:  [id number] DATE OF BIRTH:  1925-10-01  DATE OF ADMISSION:  12/04/2012  DATE OF DISCHARGE:  Likely 12/05/2012  DISCHARGE DIAGNOSES: 1.  Acute viral gastroenteritis, resolved.  2.  Hypertension.  3.  Hyperlipidemia.  4.  Diabetes mellitus type 2.  5.  Chronic kidney disease  stage III.  6.  History of recent left hip surgery, getting physical therapy.   DISPOSITION: To [facility].   PRIMARY CARE PHYSICIAN:  Viviana Simpler.   ORTHOPEDIC PHYSICIAN:  Dr. Sabra Heck.   DISCHARGE MEDICATIONS: 1.  Omeprazole 20 mg p.o. daily.  2.  Vitamin E 400 units p.o. b.i.d. 1 tablet.  3.  Amlodipine 10 mg p.o. daily.  4.  Aspirin 81 mg daily.  5.  Lasix 40 mg every other day.  6.  Vitamin D3, 50,000 units every month.  7.  Percocet 5/325 every 4 to 6  hours as needed for pain.  8.  Magnesium hydroxide as needed for constipation. Do not use it if he has diarrhea.  9.  Insulin sliding scale coverage.  10.  Lovenox 40 mg subQ daily.  11.  Ferrous sulfate 325 mg p.o. b.i.d.  12.  Lantus 50 units once a day. This should be given when he eats ADA diet and please do not give if he has nausea, vomiting or diarrhea.  13.  Zofran 8 mg every 8 hours.  14.  Flagyl 500 mg every 8 hours.  Given for 2 weeks.  15.  Cipro 500 mg every 12 hours. Given for 10 days.  16.  Enalapril 10 mg daily.  17.  Metoprolol dose has remained decreased to 50 daily.  He was taking 100 mg, but dose is decreased 18.  Hydralazine 25 > mg p.o. 4 times daily.   CONSULTATIONS: Physical therapy consult and also wound care consult.   HOSPITAL COURSE:  This is an 81 year old male with a history of recent left hip surgery with left hip hemiarthroplasty. Went to [facility]. Brought in here because of nausea, vomiting, diarrhea and abdominal pain. The patient was admitted for gastroenteritis, started on IV fluids and n.p.o. initially. The patient had only 1 episode of vomiting in the ER, but no  diarrhea. The patient was started on clear liquid diet yesterday and tolerating that, and no more episodes of diarrhea or vomiting. The patient did not have any further stools to get the stool cultures. Initial white count has been normal, afebrile, and he was started on Cipro and Flagyl. On admission, CT of the abdomen did not show any colitis, but it did show some evidence of diarrhea. The patient right now has no diarrhea. It looks like he had a viral gastroenteritis. The patient will be started on ADA diet and see if he tolerates the diet. He will be discharged back to [facility]  the evening. 2.  Pressure ulcers.  He has pressure ulcers in the left heel and sacrum. Seen by the wound care  nurse, and they recommended float heels at all times ______ heels. 3.  CT of the abdomen showed atelectasis with infiltrate in both lungs. Fluid present in colon could be because of diarrhea. Stable hydroureter. The patient has been afebrile, started on the diet. He will be going with Cipro and Flagyl. The patient's daughter also advised not to give salads for at least a few days, and then start the diet slowly and advance accordingly. He will use his stools softeners only for constipation.  4.  Diabetes mellitus type 2. The patient is on Lantus at the nursing home. The patient can resume Lantus once his p.o. intake is  good. Until then, he can continue sliding scale coverage.  5.  Hypertension. The patient has been on metoprolol 100 mg daily and Enalapril 20 mg b.i.d., but the patient's heart rate has been around 50s, so I decreased the dose to 50 daily. That needs to be further titrated to 25 as needed, because we need to make sure that he is not going to bradycardia. Continue Enalapril 10 mg daily. The patient's kidney function has been stable. He has CKD with a GFR around 36, which is stable from previous labs.  6.  The patient has left hip surgery with hemiarthroplasty. Sutures were removed, and the patient has  an appointment with Dr. Earnestine Leys for followup. The patient will continue physical therapy at [facility]. The patient does have appointment with Dr. Earnestine Leys on April 1st at 2:15 p.m. The patient can take the pain medication Percocet as needed. He can continue Lovenox 40 subQ daily for DVT prophylaxis, as he is recovering from left hip surgery.   TIME SPENT ON DISCHARGE PREPARATION: More than 30 minutes.      ____________________________ Epifanio Lesches, MD sk:dmm D: 12/05/2012 11:19:47 ET T: 12/05/2012 11:36:44 ET JOB#: [id number]  cc: Epifanio Lesches, MD, <Dictator> Venia Carbon, MD Park Breed, MD Epifanio Lesches MD ELECTRONICALLY SIGNED 12/26/2012 16:14

## 2015-01-03 NOTE — Consult Note (Signed)
   Comments   I met with pt's daughter. Pt asked that I speak with her as his hearing is very poor. Daughter is heartened at the prospect of pt going to [facility] as both she and pt see this as progress in pt's condition. Both hope that pt does not have to have longterm dialysis but daughter feels that pt would continue dialysis if necessary. spoke with daughter about pt's code status. Daughter says that pt has always said "no heroics" and by that he means no intubation, no defibrillation. She wants pt to be a DNR and is trying to complete paperwork to that end. Order entered for DNR and out-of-facility DNR completed and placed in chart.     Electronic Signatures: Luwanna Brossman, Izora Gala (MD)  (Signed 24-Apr-14 11:36)  Authored: Palliative Care   Last Updated: 24-Apr-14 11:36 by Tyrome Donatelli, Izora Gala (MD)

## 2015-01-03 NOTE — H&P (Signed)
Subjective/Chief Complaint Pain left hip   History of Present Illness 81 year old male slipped on ice in front of [employer] today injuring the left hip.  Brought to Emergency Room where exam and X-rays show a displaced subcapitla fracture left hip.  Discussed treatment with patient and son and recommended surgery which they agree to.  Risks and benefits of surgery were discussed at length including but not limited to infection, non union, nerve or blood vessed damage, non union, need for repeat surgery, blood clots and lung emboli, and death. Plan hemiarthroplasty in AM.   Primary Physician Silvio Pate   Past Med/Surgical Hx:  Hyperlipidemia:   Hypertension:   GERD:   Diabetes:   Urethral Removal:   ALLERGIES:  catgut: Blisters  HOME MEDICATIONS: Medication Instructions Status  aspirin 81 mg oral tablet 1 tab(s) orally once a day Active  Cosamin DS oral tablet 1 tab(s) orally once a day Active  enalapril 20 mg oral tablet 1 tab(s) orally 2 times a day Active  glipiZIDE 5 mg oral tablet 1 tab(s) orally once a day Active  Lasix 40 mg oral tablet 1 tab(s) orally once a day, As Needed Active  metoprolol succinate 100 mg oral tablet, extended release 1 tab(s) orally once a day (in the evening) Active  omeprazole 20 mg oral delayed release capsule 1 cap(s) orally once a day (in the evening) Active  vitamin E 400 intl units oral capsule 1 cap(s) orally 2 times a day Active  amlodipine besylate 10 mg oral tablet 1 tab(s) orally once a day (in the evening) Active  One-A-Day Multiple Vitamin oral tablet 1 tab(s) orally once a day Active  Lantus Solostar Pen 55 unit(s) subcutaneous once a day (in the morning) Active  Norco 5 mg-325 mg oral tablet 1 tab(s) orally every 4 hours, As Needed- for Pain  Active   Family and Social History:  Family History Non-Contributory   Social History negative tobacco   Place of Living Home   Review of Systems:  Fever/Chills No   Cough No    Sputum No   Abdominal Pain No   Physical Exam:  GEN well developed, well nourished   HEENT pink conjunctivae   NECK supple   RESP normal resp effort   CARD regular rate   ABD soft   GU foley catheter in place   LYMPH negative neck   EXTR negative edema, Pain left hip with range of motion. Slight shortening and rotation.  circulation/sensation/motor function good.  skin intact in incisional area.  Rash in inguinal region  without supparation.   SKIN normal to palpation, positive rashes   NEURO motor/sensory function intact   PSYCH alert, A+O to time, place, person, good insight   Radiology Results: XRay:    07-Mar-14 13:45, Hip Left Complete  Hip Left Complete  REASON FOR EXAM:    fall, hip pain  COMMENTS:       PROCEDURE: DXR - DXR HIP LEFT COMPLETE  - Nov 17 2012  1:45PM     RESULT: Comparison: None.    Findings:  There is a displaced fracture of the left femoral neck. Vascular   calcifications are present. Vascular stent is seen in the left thigh.    IMPRESSION:   Displaced left femoral neck fracture.      Verified By: Gregor Hams, M.D., MD  LabUnknown:  PACS Image    Assessment/Admission Diagnosis Displaced left subcapital hip fracture   Plan Left hip hemiarthroplasty  Electronic Signatures: Park Breed (MD)  (Signed 07-Mar-14 15:05)  Authored: CHIEF COMPLAINT and HISTORY, PAST MEDICAL/SURGIAL HISTORY, ALLERGIES, HOME MEDICATIONS, FAMILY AND SOCIAL HISTORY, REVIEW OF SYSTEMS, PHYSICAL EXAM, Radiology, ASSESSMENT AND PLAN   Last Updated: 07-Mar-14 15:05 by Park Breed (MD)

## 2015-01-03 NOTE — Consult Note (Signed)
PATIENT NAME:  [name] [name], [name] [name] MR#:  [id number] DATE OF BIRTH:  21-Jun-1926  DATE OF CONSULTATION:  12/27/2012  REFERRING PHYSICIAN:  Gladstone Lighter, MD CONSULTING PHYSICIAN:  Heinz Knuckles. Valery Amedee, MD  REASON FOR CONSULTATION: C. difficile colitis.   HISTORY OF PRESENT ILLNESS: The patient is an 81 year old male with a past history significant for chronic renal insufficiency, diabetes, recent left hip fracture, status post partial arthroplasty, who was admitted on April 14 with difficulty breathing. The patient had been in a rehab facility and been getting increasing tachypnea with worsening pedal edema. He has had some cough, but it has been nonproductive. He states he has been coughing for a few months. His shortness of breath worsened more acutely, however. He had undergone his hemiarthroplasty on March 8. He was discharged on March 12 and returned on March 24 with nausea and vomiting. At that point in time, he was kept for a day for a day or so and then returned to the skilled nursing facility. He has not had any further nausea and vomiting. It is unclear why he was diagnosed with viral gastroenteritis but discharged on Cipro and Flagyl during that last hospitalization. He has been having loose stool for the last few weeks. He could not give an exact date. He feels it is related to the sodium bicarbonate he has been receiving from nephrology. He has had multiple loose stools since being in the hospital. He denies any fevers, chills or sweats.   ALLERGIES: INCLUDE CATGUT.  PAST MEDICAL HISTORY:  1.  Hypertension.  2.  Hypercholesterolemia.  3.  Diabetes.  4.  GERD.  5.  Transitional carcinoma of the bladder.  6.  Pressure ulcer of the left heel.  7.  Left hip fracture, status post hemiarthroplasty.  8.  Chronic renal insufficiency.  9.  Status post appendectomy.   SOCIAL HISTORY: The patient has recently been living in a skilled nursing facility. He does not smoke. He does not drink. No  history of injecting drug use.   FAMILY HISTORY: Both his parents were healthy until their 55s.   REVIEW OF SYSTEMS:    GENERAL: No fevers, chills or sweats. Some fatigue. Some difficulty ambulating.  HEENT: No headaches. No sinus congestion. No sore throat.  NECK: No stiffness. No swollen glands.  RESPIRATORY: Positive cough that has been nonproductive. No wheezing. Positive shortness of breath that has been worsening recently.  CARDIAC: No chest pains or palpitations. Positive increased lower extremity edema and dyspnea on exertion.  GASTROINTESTINAL: No nausea. No vomiting. Positive diarrhea. No abdominal pain. No blood in the stool.  GENITOURINARY: No dysuria. No increased frequency.  MUSCULOSKELETAL: He had been doing well since his hip fracture.  SKIN: No rashes other than a pressure ulcer on his left heel.  NEUROLOGIC: No focal weakness, but he has been globally somewhat weak.  PSYCHIATRIC: No complaints. All other systems are negative.   PHYSICAL EXAMINATION:  VITAL SIGNS: T-max of 98.7, T-current of 98.0, pulse 72, blood pressure 122/60, 95% on 2 liters.  GENERAL: An 81 year old white man in no acute distress.  HEENT: Normocephalic, atraumatic. Pupils equal and reactive to light. Extraocular motion intact. Sclerae, conjunctivae and lids without evidence for emboli or petechiae. Oropharynx shows no erythema or exudate. Teeth and gums are in fair condition.  NECK: Supple. Full range of motion. Midline trachea. No lymphadenopathy. No thyromegaly.  LUNGS: Clear to auscultation bilaterally. Some decreased breath sounds but no wheezing. No focal consolidation. Able to speak in full sentences.  CARDIAC: Regular rate and rhythm without murmur, rub or gallop.  ABDOMEN: Soft, nontender, nondistended. No hepatosplenomegaly. No hernias noted.  EXTREMITIES: He has bilateral lower extremity edema.  SKIN: There is an ulcer on the left heel that was not directly visualized, as it was wrapped in  bandages. The right lower extremity had some patchy red areas, but the area was not warm to touch and was not tender. There were no stigmata of endocarditis, specifically, no Janeway lesions or Osler nodes.  NEUROLOGIC: The patient was awake and interactive, moving all 4 extremities.  PSYCHIATRIC: Mood and affect appeared normal.   LABORATORY AND RADIOLOGIC DATA: BUN of 83, creatinine 5.52, bicarbonate 22, anion gap of 9. LFTs on admission were unremarkable. White count today was 6.7. On admission, his white count was 7.9 with an ANC of 6.6. Hemoglobin from yesterday was 8.6 with a platelet count 252. Blood cultures from admission are negative. A C. difficile PCR from admission is positive. A chest x-ray from admission shows a right-sided infiltrate with possible developing cavitary process. A CT scan of the chest without contrast showed moderate-sized bilateral pleural effusions. There was consolidation and volume loss of the lower lobes, likely secondary to atelectasis from the pleural effusions. There was no area of cavitation. Renal ultrasound showed no acute abnormality of the kidneys. A chest x-ray from today showed increased confluence in the right lower lobe infiltrate. There were no new focal areas of consolidation. There was interstitial infiltrate accentuated by the patient's shallow inspiration.   IMPRESSION: An 81 year old man with a history of diabetes, recent hip fracture, status post hemiarthroplasty, and chronic renal insufficiency admitted with bilateral pleural effusions, worsening renal failure and Clostridium difficile colitis.   RECOMMENDATIONS:  1.  His chest x-ray shows a possible infiltrate, but his CT scan showed pleural effusions with compressive atelectasis. He does not need therapy for healthcare-associated pneumonia.  2.  He is having multiple loose stools per day and his C. difficile PCR is positive. Will continue metronidazole.  3.  Given his worsening renal function, he is  getting increased intravenous fluids. This will likely worsen his pleural fluid collection. He may require therapeutic pleurocentesis if his oxygenation worsens.  4.  If his oxygenation worsens, he may end up requiring therapeutic pleurocentesis. An echocardiogram was performed on admission, which showed EF of 50% to 55% with mild LVH and a mildly dilated left atrium.  5.  Would stop his antibiotics other than the metronidazole.   This is a moderately complex infectious disease consult. Thank you very much for involving me in this patient's care.   ____________________________ Heinz Knuckles. Eleasha Cataldo, MD meb:jm D: 12/27/2012 17:28:13 ET T: 12/27/2012 17:45:25 ET JOB#: [id number]  cc: Heinz Knuckles. Leone Mobley, MD, <Dictator> Brynli Ollis E Carron Jaggi MD ELECTRONICALLY SIGNED 12/28/2012 8:20

## 2015-01-03 NOTE — Op Note (Signed)
PATIENT NAME:  [name] [name], [name] [name] MR#:  [id number] DATE OF BIRTH:  [birth year]  DATE OF PROCEDURE:  01/08/2013  PREOPERATIVE DIAGNOSES: 1.  End-stage renal disease.  2.  Volume overload.  3.  Respiratory insufficiency.  4.  Hypertension.   POSTOPERATIVE DIAGNOSES: 1.  End-stage renal disease.  2.  Volume overload.  3.  Respiratory insufficiency.  4.  Hypertension.   PROCEDURES: 1.  Guidance for vascular access, right jugular vein.  2.  Fluoroscopic guidance for placement of catheter.  3.  Placement of 19 cm tip to cuff tunneled hemodialysis catheter.   SURGEON: Algernon Huxley, M.D.   ANESTHESIA: Local, with moderate conscious sedation.   ESTIMATED BLOOD LOSS: 25 mL.   FLUOROSCOPY TIME: Less than 1 minute.   CONTRAST USED: None.   INDICATION FOR PROCEDURE: An 81 year old white male with chronic kidney disease who now appears to have progressed to end-stage renal disease. He has required dialysis several days. We had planned to place a PermCath several days ago but he was too volume overloaded and could not tolerate sedation lying flat. At this point, he was doing well on a high-flow oxygen mask, lying flat.   DESCRIPTION OF PROCEDURE: The patient was brought to the vascular interventional radiology suite. Right neck and chest were sterilely prepped and draped and a sterile surgical field was created. The right jugular vein was visualized with ultrasound and found to be widely patent. It was then accessed under direct ultrasound guidance without difficulty with a Seldinger needle. A J wire was placed. After skin nick and dilatation, the peel-away sheath was placed over the wire and the wire and dilator removed. I then tunneled from the subclavicular counter-incision to the access site using fluoroscopic guidance to select a 19 cm tip-to-cuff tunneled hemodialysis catheter. I placed this through the peel-away sheath and parked the catheter tip just into the right atrium. The appropriate  distal connectors were placed. It withdrew blood well and flushed easily with heparinized saline. A concentrated heparin solution was placed. It was secured to the chest wall with 2 Prolene sutures. A 4-0 Monocryl purse suture was placed around  the catheter exit site and a 4-0 Monocryl suture was used to close the access site. Sterile dressings were placed. The patient tolerated the procedure well and was taken to the recovery room in stable condition.     ____________________________ Algernon Huxley, MD jsd:dm D: 01/08/2013 12:21:42 ET T: 01/08/2013 12:53:06 ET JOB#: [id number]  cc: Algernon Huxley, MD, <Dictator> Algernon Huxley MD ELECTRONICALLY SIGNED 01/10/2013 13:24

## 2015-01-05 NOTE — Op Note (Signed)
PATIENT NAME:  [name] [name], [name] [name] MR#:  [id number] DATE OF BIRTH:  10/31/1925  DATE OF PROCEDURE:  12/27/2011  PREOPERATIVE DIAGNOSIS: Transitional cell carcinoma of the bladder.   POSTOPERATIVE DIAGNOSIS: Transitional cell carcinoma of the bladder.   PROCEDURE: TUR of transitional cell carcinoma.   SURGEON: Alcus Dad, MD  ANESTHESIA: General.   INDICATIONS: This 81 year old man has had a history of transitional cell carcinoma of the bladder. On his last surveillance cystoscopy, he was found to have recurrent tumor at the bladder neck and on the left bladder wall. He is scheduled for resection of these lesions.   DESCRIPTION OF PROCEDURE: In the dorsal lithotomy position under general anesthesia, the lower abdomen and genital area was prepped and draped for endoscopic work. The patient has BXO and the urethra was dilated accordingly using [employer] sounds. 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 He was dilated to 52 [place].   The 24 French resectoscope sheath was introduced and inspection of the bladder performed. No lesions were present, except those previously mentioned at the bladder neck on the left and the lateral left bladder wall at approximately 2:30.   The resectoscope loop was introduced and the lesion on the bladder wall resected. The lesion at the bladder neck was then resected. The entire area was cauterized to control bleeding and obviate any tissue which appeared to be hyperemic or  active. Chips were retrieved, although        they were quite small. A #18 French Foley catheter was placed in the bladder and left indwelling. The patient tolerated the procedure well and returned to the recovery area in satisfactory condition. ____________________________ Alcus Dad, MD jsh:slb D: 12/27/2011 13:09:31 ET T: 12/27/2011 16:21:49 ET JOB#: [id number]  cc: Alcus Dad, MD, <Dictator> Alcus Dad  MD ELECTRONICALLY SIGNED 12/28/2011 13:32
